# Patient Record
Sex: Female | Born: 1965 | Race: White | Hispanic: No | Marital: Married | Smoking: Former smoker
Health system: Southern US, Community
[De-identification: ages and names within clinical notes are randomized; demographics above are authoritative.]

## PROBLEM LIST (undated history)

## (undated) DIAGNOSIS — Z973 Presence of spectacles and contact lenses: Secondary | ICD-10-CM

## (undated) DIAGNOSIS — K819 Cholecystitis, unspecified: Secondary | ICD-10-CM

## (undated) HISTORY — PX: GANGLION CYST EXCISION: SHX1691

---

## 1997-09-06 ENCOUNTER — Other Ambulatory Visit: Admission: RE | Admit: 1997-09-06 | Discharge: 1997-09-06 | Payer: Self-pay | Admitting: *Deleted

## 1998-04-04 ENCOUNTER — Inpatient Hospital Stay (HOSPITAL_COMMUNITY): Admission: AD | Admit: 1998-04-04 | Discharge: 1998-04-06 | Payer: Self-pay | Admitting: *Deleted

## 1998-05-18 ENCOUNTER — Other Ambulatory Visit: Admission: RE | Admit: 1998-05-18 | Discharge: 1998-05-18 | Payer: Self-pay | Admitting: Obstetrics and Gynecology

## 1999-03-28 ENCOUNTER — Other Ambulatory Visit: Admission: RE | Admit: 1999-03-28 | Discharge: 1999-03-28 | Payer: Self-pay | Admitting: Obstetrics and Gynecology

## 1999-07-09 ENCOUNTER — Other Ambulatory Visit: Admission: RE | Admit: 1999-07-09 | Discharge: 1999-07-09 | Payer: Self-pay | Admitting: Obstetrics and Gynecology

## 1999-07-16 ENCOUNTER — Encounter: Admission: RE | Admit: 1999-07-16 | Discharge: 1999-10-14 | Payer: Self-pay | Admitting: Obstetrics and Gynecology

## 1999-10-16 ENCOUNTER — Inpatient Hospital Stay (HOSPITAL_COMMUNITY): Admission: AD | Admit: 1999-10-16 | Discharge: 1999-10-18 | Payer: Self-pay | Admitting: Obstetrics and Gynecology

## 1999-11-27 ENCOUNTER — Other Ambulatory Visit: Admission: RE | Admit: 1999-11-27 | Discharge: 1999-11-27 | Payer: Self-pay | Admitting: Obstetrics and Gynecology

## 2001-04-13 ENCOUNTER — Other Ambulatory Visit: Admission: RE | Admit: 2001-04-13 | Discharge: 2001-04-13 | Payer: Self-pay | Admitting: Obstetrics and Gynecology

## 2008-11-24 ENCOUNTER — Other Ambulatory Visit: Admission: RE | Admit: 2008-11-24 | Discharge: 2008-11-24 | Payer: Self-pay | Admitting: Family Medicine

## 2008-11-24 ENCOUNTER — Ambulatory Visit: Payer: Self-pay | Admitting: Family Medicine

## 2008-11-24 ENCOUNTER — Encounter: Payer: Self-pay | Admitting: Family Medicine

## 2008-11-24 LAB — CONVERTED CEMR LAB
ALT: 32 units/L (ref 0–35)
AST: 21 units/L (ref 0–37)
Albumin: 4.2 g/dL (ref 3.5–5.2)
Alkaline Phosphatase: 45 units/L (ref 39–117)
Chloride: 104 meq/L (ref 96–112)
Cholesterol: 172 mg/dL (ref 0–200)
GFR calc non Af Amer: 83.93 mL/min (ref >60)
Glucose, Bld: 100 mg/dL — ABNORMAL HIGH (ref 70–99)
Potassium: 4.4 meq/L (ref 3.5–5.1)
Sodium: 140 meq/L (ref 135–145)
Total Bilirubin: 0.8 mg/dL (ref 0.3–1.2)
VLDL: 12.2 mg/dL (ref 0.0–40.0)

## 2008-11-28 ENCOUNTER — Encounter (INDEPENDENT_AMBULATORY_CARE_PROVIDER_SITE_OTHER): Payer: Self-pay | Admitting: *Deleted

## 2008-12-21 ENCOUNTER — Ambulatory Visit: Payer: Self-pay | Admitting: Family Medicine

## 2008-12-21 ENCOUNTER — Encounter: Payer: Self-pay | Admitting: Family Medicine

## 2010-06-01 NOTE — H&P (Signed)
[facility]  Patient:    Carrie Gray, Carrie Gray                       MRN: [id number] Adm. Date:  [id number] Attending:  Genia Del                         History and Physical  CHIEF COMPLAINT:              Active labor.  HISTORY OF PRESENT ILLNESS:   A 45 year old white female, gravida 3, para 2, EDD October 16, 1999, at [redacted] weeks gestation in active labor.  ALLERGIES:                    No known drug allergies.  MEDICATIONS:                  Prenatal vitamins.  PAST OBSTETRIC HISTORY:       History of two spontaneous vaginal deliveries in 1997 and 2000.  No other medical or surgical hospitalizations.  FAMILY HISTORY:               Insulin-dependent diabetes mellitus, heart disease, epilepsy, and thyroid disease.  SOCIAL HISTORY:               Noncontributory.  PRENATAL LABORATORY DATA:     Blood type A positive, rh antibody negative. Rubella immune.  Hepatitis B surface antigen negative.  HIV nonreactive. Pregnancy otherwise uncomplicated.  PHYSICAL EXAMINATION:  GENERAL:                      A well-developed, well-nourished white female in no acute distress.  HEENT:                        Normal.  LUNGS:                        Clear.  HEART:                        Regular rate and rhythm.  ABDOMEN:                      Soft, gravid, and nontender.  Estimated fetal weight of about 5-1/2 pounds to 6 pounds.  PELVIC:                       Cervix is 5 cm, 90%, vertex and 0.  EXTREMITIES:                  No cords.  NEUROLOGICAL:                 Nonfocal.  IMPRESSION:                   Term intrauterine pregnancy in active labor.  PLAN:                         Proceed with artificial rupture of membranes, Pitocin augmentation as needed. DD:  10/16/99 TD:  10/16/99 Job: [id number]

## 2010-06-01 NOTE — H&P (Signed)
[facility]  Patient:    Carrie Gray, Carrie Gray                       MRN: [id number] Adm. Date:  [id number] Attending:  Genia Del                         History and Physical  CHIEF COMPLAINT:              Contracting every four to six minutes.  HISTORY OF PRESENT LABOR:     This is a 45 year old gravida 3, para 1-1-0-2 female, now at term, being admitted in early labor.  The patient presented to the office complaining of contractions every four to six minutes with an intact membrane.  On examination, she was 4, 80%, -1 to 0 station with bulging membranes.  Her group B strep culture is negative.  The patient had her membranes stripped on October 15, 1999.  At that time, her cervix 3, 60% and -2.  Her prenatal course has been complicated by growth restriction early in the pregnancy which has subsequently resolved.  Her last ultrasound was on September 19, 1999, at which time the babys weight was 5 pounds 10 ounces, which is at the 21st percentile, and normal amniotic fluid index.  Prenatal care is at [facility].  Primary obstetrician:  Dr. Nena Jordan A. Cousins. Blood type is A-positive.  Antibody screen is negative.  RPR is nonreactive. Rubella is immune.  Hepatitis B surface antigen is negative.  GC and Chlamydia cultures were negative.  AFP-3 test was normal.  Normal one-hour GCT.  Patient is immune to parvovirus.  Group B strep culture is negative.  Patient was followed for asymmetrical intrauterine growth restriction and had a second opinion by Dr. Conni Elliot at [facility] regarding the management.  ALLERGIES:                    No known drug allergies.  MEDICINES:                    Prenatal vitamins.  MEDICAL HISTORY:              Negative.  SURGICAL HISTORY:             Negative.  OBSTETRICAL HISTORY:          Vaginal delivery, 6-pound 5-ounce baby at 36-1/2 weeks, 1997; March 2000, term delivery, 7 pounds 2 ounces, vaginal  delivery, no complications.  FAMILY HISTORY:               Mother had a partial thyroidectomy.  Maternal grandmother has insulin-dependent diabetes.  Father died of an MI at age 48.  SOCIAL HISTORY:               Married.  Nonsmoker.  Homemaker.  Two children.  PHYSICAL EXAMINATION  GENERAL:                      Well-developed, well-nourished gravid female in no acute distress.  VITAL SIGNS:                  Blood pressure 100/70.  Weight is 166.8 pounds.  SKIN:                         No lesions.  HEENT:  Anicteric sclerae.  Pink conjunctivae. Oropharynx negative.  HEART:                        Regular rate and rhythm.  BREASTS:                      Soft, nontender.  No palpable mass.  LUNGS:                        Clear to auscultation.  ABDOMEN:                      Gravid, fundal height of 36 cm.  PELVIC:                       As per history of present illness.  IMPRESSION:                   Term gestation, early labor.  PLAN:                         Admission, routine admission labs, patient to ambulate for an hour after reactive NST, artificial rupture of membranes p.r.n., analgesics p.r.n. DD:  10/16/99 TD:  10/16/99 Job: [id number]

## 2010-11-12 ENCOUNTER — Telehealth: Payer: Self-pay | Admitting: *Deleted

## 2010-11-12 NOTE — Telephone Encounter (Signed)
Chart opened in error

## 2012-06-03 ENCOUNTER — Encounter: Payer: Self-pay | Admitting: Family Medicine

## 2012-06-03 ENCOUNTER — Ambulatory Visit (INDEPENDENT_AMBULATORY_CARE_PROVIDER_SITE_OTHER): Payer: BC Managed Care – PPO | Admitting: Family Medicine

## 2012-06-03 VITALS — BP 122/90 | HR 84 | Temp 97.9°F | Wt 165.0 lb

## 2012-06-03 DIAGNOSIS — J309 Allergic rhinitis, unspecified: Secondary | ICD-10-CM

## 2012-06-03 DIAGNOSIS — J029 Acute pharyngitis, unspecified: Secondary | ICD-10-CM

## 2012-06-03 NOTE — Progress Notes (Signed)
SUBJECTIVE:  Carrie Gray is a 47 y.o. female who complains of sneezing, dry cough and sore throat for 3 days. She denies a history of anorexia, chest pain, chills, dizziness, fatigue, fevers, nausea, shortness of breath, sweats and vomiting and denies a history of asthma. Patient denies smoke cigarettes.   Does have h/o seasonal allergies.  Patient Active Problem List   Diagnosis Date Noted  . Allergic rhinitis 06/03/2012   No past medical history on file. No past surgical history on file. History  Substance Use Topics  . Smoking status: Former Games developer  . Smokeless tobacco: Not on file  . Alcohol Use: Not on file   No family history on file. No Known Allergies No current outpatient prescriptions on file prior to visit.   No current facility-administered medications on file prior to visit.   The PMH, PSH, Social History, Family History, Medications, and allergies have been reviewed in [facility], and have been updated if relevant.  OBJECTIVE: BP 122/90  Pulse 84  Temp(Src) 97.9 F (36.6 C)  Wt 165 lb (74.844 kg)  BMI 28.77 kg/m2  She appears well, vital signs are as noted. Ears normal.  Throat and pharynx normal.  Neck supple. No adenopathy in the neck. Nose is congested. Sinuses non tender. The chest is clear, without wheezes or rales.  ASSESSMENT:  allergic rhinitis  PLAN: Symptomatic therapy suggested: push fluids, rest and return office visit prn if symptoms persist or worsen.  Call or return to clinic prn if these symptoms worsen or fail to improve as anticipated.

## 2012-06-03 NOTE — Addendum Note (Signed)
Addended by: Eliezer Bottom on: 06/03/2012 12:18 PM   Modules accepted: Orders

## 2012-06-03 NOTE — Patient Instructions (Addendum)
Good to see you. Have a great anniversary vacation!  Try an antihistamine like claritin, allegra or zyrtec D over the counter.  Try over the counter nasocort-start with 2 sprays per nostril per day...and then try to taper to 1 spray per nostril once symptoms improve.

## 2012-06-12 ENCOUNTER — Telehealth: Payer: Self-pay | Admitting: Family Medicine

## 2012-06-12 MED ORDER — AZITHROMYCIN 250 MG PO TABS: ORAL_TABLET | ORAL | Status: DC

## 2012-06-12 NOTE — Telephone Encounter (Signed)
Rx for zpack sent to pharmacy.  Please keep [place] updated with symptoms and I hope she feels better soon.

## 2012-06-12 NOTE — Telephone Encounter (Signed)
Patient Information:  Caller Name: Rhilee  Phone: [phone]  Patient: Carrie, Gray  Gender: Female  DOB: Jul 10, 1965  Age: 47 Years  PCP: [facility])  Pregnant: No  Office Follow Up:  Does the office need to follow up with this patient?: Yes  Instructions For The Office: Pt wants to be seen today if possible. 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 Since pt was seen last week/also is no appts /would MD consider calling in an antibiotic.   Symptoms  Reason For Call & Symptoms: Pt was seen on 06/03/12 for a sore throat/cough. Pt was advised by MD it was allergies. Pt was advised to take Claritin which she did which did not help. Sore throat  has resolved but pt is calling back to say the cough has worsened. Pt has a temp this am of 100 (orally) and is wondering if it has turned into an infection. cough is non-productive.  Reviewed Health History In EMR: Yes  Reviewed Medications In EMR: Yes  Reviewed Allergies In EMR: Yes  Reviewed Surgeries / Procedures: Yes  Date of Onset of Symptoms: 06/03/2012 OB / GYN:  LMP: Unknown  Guideline(s) Used:  Cough  Disposition Per Guideline:   See Within 3 Days in Office  Reason For Disposition Reached:   Cough has been present for > 10 days  Advice Given:  N/A  Patient Will Follow Care Advice:  YES

## 2012-06-12 NOTE — Telephone Encounter (Signed)
Advised patient

## 2012-07-31 ENCOUNTER — Ambulatory Visit (INDEPENDENT_AMBULATORY_CARE_PROVIDER_SITE_OTHER): Payer: BC Managed Care – PPO | Admitting: Family Medicine

## 2012-07-31 ENCOUNTER — Other Ambulatory Visit (HOSPITAL_COMMUNITY)
Admission: RE | Admit: 2012-07-31 | Discharge: 2012-07-31 | Disposition: A | Discharge status: Home / Self Care | Payer: BC Managed Care – PPO | Source: Ambulatory Visit | Attending: Family Medicine | Admitting: Family Medicine

## 2012-07-31 ENCOUNTER — Encounter: Payer: Self-pay | Admitting: Family Medicine

## 2012-07-31 VITALS — BP 110/80 | HR 80 | Temp 98.1°F | Ht 64.0 in | Wt 167.0 lb

## 2012-07-31 DIAGNOSIS — Z0000 Encounter for general adult medical examination without abnormal findings: Secondary | ICD-10-CM

## 2012-07-31 DIAGNOSIS — Z136 Encounter for screening for cardiovascular disorders: Secondary | ICD-10-CM

## 2012-07-31 DIAGNOSIS — Z01419 Encounter for gynecological examination (general) (routine) without abnormal findings: Secondary | ICD-10-CM

## 2012-07-31 DIAGNOSIS — Z1151 Encounter for screening for human papillomavirus (HPV): Secondary | ICD-10-CM | POA: Insufficient documentation

## 2012-07-31 DIAGNOSIS — Z1231 Encounter for screening mammogram for malignant neoplasm of breast: Secondary | ICD-10-CM

## 2012-07-31 LAB — CBC WITH DIFFERENTIAL/PLATELET
Basophils Absolute: 0 10*3/uL (ref 0.0–0.1)
Basophils Relative: 0 % (ref 0–1)
Eosinophils Absolute: 0.1 10*3/uL (ref 0.0–0.7)
Eosinophils Relative: 1 % (ref 0–5)
MCH: 30.1 pg (ref 26.0–34.0)
MCHC: 34 g/dL (ref 30.0–36.0)
MCV: 88.5 fL (ref 78.0–100.0)
Platelets: 254 10*3/uL (ref 150–400)
RDW: 13.6 % (ref 11.5–15.5)

## 2012-07-31 NOTE — Patient Instructions (Addendum)
Good to see you.  We will call you with your lab results or you may view them online.  Please stop by to see Carrie Gray on your way out to set up your mammogram.  Have a great time in [place]!

## 2012-07-31 NOTE — Progress Notes (Signed)
Subjective:    Patient ID: Carrie Gray, female    DOB: 07/11/1965, 47 y.o.   MRN: [id number]  HPI  47 yo pleasant G3P3 here to re establish care and for CPX/pap smear.  No h/o abnormal pap smears.  Sexually active with husband only. Last pap smear and mammogram were in 2010.  Periods are still regular.  Denies any insomnia, hot flashes, vaginal discharge or dysuria.  Mother recently diagnosed with colon CA at 45 yo.  She feels she is handling this new stressor ok.  She has no family h/o breast, uterine or cervical CA.  Tries to stay active- has 3 children.  Patient Active Problem List   Diagnosis Date Noted  . Routine general medical examination at a health care facility 07/31/2012  . Routine gynecological examination 07/31/2012  . Allergic rhinitis 06/03/2012   No past medical history on file. No past surgical history on file. History  Substance Use Topics  . Smoking status: Former Games developer  . Smokeless tobacco: Not on file  . Alcohol Use: Not on file   Family History  Problem Relation Age of Onset  . Cancer Mother 71    colon   No Known Allergies No current outpatient prescriptions on file prior to visit.   No current facility-administered medications on file prior to visit.   The PMH, PSH, Social History, Family History, Medications, and allergies have been reviewed in [facility], and have been updated if relevant.    Review of Systems See HPI Patient reports no  vision/ hearing changes,anorexia, weight change, fever ,adenopathy, persistant / recurrent hoarseness, swallowing issues, chest pain, edema,persistant / recurrent cough, hemoptysis, dyspnea(rest, exertional, paroxysmal nocturnal), gastrointestinal  bleeding (melena, rectal bleeding), abdominal pain, excessive heart burn, GU symptoms(dysuria, hematuria, pyuria, voiding/incontinence  Issues) syncope, focal weakness, severe memory loss, concerning skin lesions, depression, anxiety, abnormal bruising/bleeding, major  joint swelling, breast masses or abnormal vaginal bleeding.       Objective:   Physical Exam BP 110/80  Pulse 80  Temp(Src) 98.1 F (36.7 C)  Ht 5\' 4"  (1.626 m)  Wt 167 lb (75.751 kg)  BMI 28.65 kg/m2  General:  Well-developed,well-nourished,in no acute distress; alert,appropriate and cooperative throughout examination Head:  normocephalic and atraumatic.   Eyes:  vision grossly intact, pupils equal, pupils round, and pupils reactive to light.   Ears:  R ear normal and L ear normal.   Nose:  no external deformity.   Mouth:  good dentition.   Neck:  No deformities, masses, or tenderness noted. Breasts:  No mass, nodules, thickening, tenderness, bulging, retraction, inflamation, nipple discharge or skin changes noted.   Lungs:  Normal respiratory effort, chest expands symmetrically. Lungs are clear to auscultation, no crackles or wheezes. Heart:  Normal rate and regular rhythm. S1 and S2 normal without gallop, murmur, click, rub or other extra sounds. Abdomen:  Bowel sounds positive,abdomen soft and non-tender without masses, organomegaly or hernias noted. Rectal:  no external abnormalities.   Genitalia:  Pelvic Exam:        External: normal female genitalia without lesions or masses        Vagina: normal without lesions or masses        Cervix: normal without lesions or masses        Adnexa: normal bimanual exam without masses or fullness        Uterus: normal by palpation        Pap smear: performed Msk:  No deformity or scoliosis noted of thoracic or lumbar spine.  Extremities:  No clubbing, cyanosis, edema, or deformity noted with normal full range of motion of all joints.   Neurologic:  alert & oriented X3 and gait normal.   Skin:  Intact without suspicious lesions or rashes Cervical Nodes:  No lymphadenopathy noted Axillary Nodes:  No palpable lymphadenopathy Psych:  Cognition and judgment appear intact. Alert and cooperative with normal attention span and concentration. No  apparent delusions, illusions, hallucinations        Assessment & Plan:  1. Routine general medical examination at a health care facility Reviewed preventive care protocols, scheduled due services, and updated immunizations Discussed nutrition, exercise, diet, and healthy lifestyle.  - Comprehensive metabolic panel - CBC with Differential  2. Routine gynecological examination Pap smear today.  3. Other screening mammogram  - MM Digital Screening; Future  4. Screening for ischemic heart disease  - Lipid Panel

## 2012-08-01 LAB — LIPID PANEL
Cholesterol: 208 mg/dL — ABNORMAL HIGH (ref 0–200)
HDL: 42 mg/dL (ref >39)
Total CHOL/HDL Ratio: 5 Ratio
VLDL: 38 mg/dL (ref 0–40)

## 2012-08-01 LAB — COMPREHENSIVE METABOLIC PANEL
BUN: 12 mg/dL (ref 6–23)
CO2: 26 mEq/L (ref 19–32)
Calcium: 9.4 mg/dL (ref 8.4–10.5)
Chloride: 104 mEq/L (ref 96–112)
Creat: 0.96 mg/dL (ref 0.50–1.10)
Total Bilirubin: 0.4 mg/dL (ref 0.3–1.2)

## 2012-08-04 ENCOUNTER — Encounter: Payer: Self-pay | Admitting: *Deleted

## 2012-08-05 ENCOUNTER — Encounter: Payer: Self-pay | Admitting: *Deleted

## 2012-11-19 ENCOUNTER — Other Ambulatory Visit: Payer: Self-pay

## 2014-08-17 ENCOUNTER — Other Ambulatory Visit (HOSPITAL_COMMUNITY)
Admission: RE | Admit: 2014-08-17 | Discharge: 2014-08-17 | Disposition: A | Discharge status: Home / Self Care | Payer: BC Managed Care – PPO | Source: Ambulatory Visit | Attending: Family Medicine | Admitting: Family Medicine

## 2014-08-17 ENCOUNTER — Ambulatory Visit (INDEPENDENT_AMBULATORY_CARE_PROVIDER_SITE_OTHER): Payer: BC Managed Care – PPO | Admitting: Family Medicine

## 2014-08-17 ENCOUNTER — Encounter: Payer: Self-pay | Admitting: Family Medicine

## 2014-08-17 VITALS — BP 120/60 | HR 78 | Temp 98.0°F | Ht 64.0 in | Wt 169.0 lb

## 2014-08-17 DIAGNOSIS — Z0000 Encounter for general adult medical examination without abnormal findings: Secondary | ICD-10-CM | POA: Diagnosis not present

## 2014-08-17 DIAGNOSIS — Z1239 Encounter for other screening for malignant neoplasm of breast: Secondary | ICD-10-CM

## 2014-08-17 DIAGNOSIS — Z01419 Encounter for gynecological examination (general) (routine) without abnormal findings: Secondary | ICD-10-CM | POA: Insufficient documentation

## 2014-08-17 DIAGNOSIS — F4321 Adjustment disorder with depressed mood: Secondary | ICD-10-CM | POA: Diagnosis not present

## 2014-08-17 DIAGNOSIS — R42 Dizziness and giddiness: Secondary | ICD-10-CM | POA: Diagnosis not present

## 2014-08-17 LAB — CBC WITH DIFFERENTIAL/PLATELET
BASOS ABS: 0 10*3/uL (ref 0.0–0.1)
Basophils Relative: 0.5 % (ref 0.0–3.0)
EOS PCT: 0.6 % (ref 0.0–5.0)
Eosinophils Absolute: 0 10*3/uL (ref 0.0–0.7)
HEMATOCRIT: 43.2 % (ref 36.0–46.0)
Hemoglobin: 14.5 g/dL (ref 12.0–15.0)
LYMPHS PCT: 28.8 % (ref 12.0–46.0)
Lymphs Abs: 2.1 10*3/uL (ref 0.7–4.0)
MCHC: 33.6 g/dL (ref 30.0–36.0)
MCV: 88.7 fl (ref 78.0–100.0)
MONOS PCT: 7.9 % (ref 3.0–12.0)
Monocytes Absolute: 0.6 10*3/uL (ref 0.1–1.0)
Neutro Abs: 4.5 10*3/uL (ref 1.4–7.7)
Neutrophils Relative %: 62.2 % (ref 43.0–77.0)
Platelets: 241 10*3/uL (ref 150.0–400.0)
RBC: 4.86 Mil/uL (ref 3.87–5.11)
RDW: 12.9 % (ref 11.5–15.5)
WBC: 7.2 10*3/uL (ref 4.0–10.5)

## 2014-08-17 LAB — COMPREHENSIVE METABOLIC PANEL
ALBUMIN: 4.4 g/dL (ref 3.5–5.2)
ALT: 33 U/L (ref 0–35)
AST: 23 U/L (ref 0–37)
Alkaline Phosphatase: 45 U/L (ref 39–117)
BILIRUBIN TOTAL: 0.5 mg/dL (ref 0.2–1.2)
BUN: 13 mg/dL (ref 6–23)
CALCIUM: 9.2 mg/dL (ref 8.4–10.5)
CHLORIDE: 106 meq/L (ref 96–112)
CO2: 27 mEq/L (ref 19–32)
Creatinine, Ser: 0.98 mg/dL (ref 0.40–1.20)
GFR: 64.67 mL/min (ref >60.00)
Glucose, Bld: 97 mg/dL (ref 70–99)
Potassium: 4.3 mEq/L (ref 3.5–5.1)
SODIUM: 139 meq/L (ref 135–145)
Total Protein: 7.1 g/dL (ref 6.0–8.3)

## 2014-08-17 LAB — LIPID PANEL
CHOLESTEROL: 185 mg/dL (ref 0–200)
HDL: 53 mg/dL (ref >39.00)
LDL Cholesterol: 108 mg/dL — ABNORMAL HIGH (ref 0–99)
NonHDL: 131.57
TRIGLYCERIDES: 117 mg/dL (ref 0.0–149.0)
Total CHOL/HDL Ratio: 3
VLDL: 23.4 mg/dL (ref 0.0–40.0)

## 2014-08-17 LAB — TSH: TSH: 4.05 u[IU]/mL (ref 0.35–4.50)

## 2014-08-17 LAB — VITAMIN B12: VITAMIN B 12: 322 pg/mL (ref 211–911)

## 2014-08-17 NOTE — Progress Notes (Signed)
Pre visit review using our clinic review tool, if applicable. No additional management support is needed unless otherwise documented below in the visit note. 

## 2014-08-17 NOTE — Assessment & Plan Note (Signed)
Very early stages of grief, grief counseling strongly suggested. Offered my condolences and support.

## 2014-08-17 NOTE — Assessment & Plan Note (Signed)
New- likely due to anxiety related to recent stressors. Exam and history reassuring. Start with labs today for further work up, she will seek out grief counseling through hospice and keep me updated. The patient indicates understanding of these issues and agrees with the plan.

## 2014-08-17 NOTE — Progress Notes (Signed)
Subjective:    Patient ID: Carrie Gray, female    DOB: 1965/04/09, 49 y.o.   MRN: [id number]  HPI  49 yo pleasant G3P3 here for CPX/pap smear and to discuss dizziness.  Mom died last week.  She had been sick with colon cancer for 2 years. She has not yet started grief counseling through hospice.  Sleeping ok.  Tearful but feels she is accepting the loss.  Has been having dizzy spells that go away when she takes a deep breath for past couple of months. She never feels like she will pass out or vomit. No CP or SOB.  No changes in vision when this happens.  Work has been more stressful as well.  No h/o abnormal pap smears.  Sexually active with husband only. Last pap smear was done on 08/05/12 (me) and mammogram was 2010. Eye exam 3 weeks- going through further testing for glaucoma.  Periods are still regular.  Denies any insomnia, hot flashes, vaginal discharge or dysuria.  She has no family h/o breast, uterine or cervical CA.   Lab Results  Component Value Date   CREATININE 0.96 07/31/2012   Lab Results  Component Value Date   CHOL 208* 07/31/2012   HDL 42 07/31/2012   LDLCALC 128* 07/31/2012   TRIG 190* 07/31/2012   CHOLHDL 5.0 07/31/2012   Lab Results  Component Value Date   WBC 6.5 07/31/2012   HGB 13.8 07/31/2012   HCT 40.6 07/31/2012   MCV 88.5 07/31/2012   PLT 254 07/31/2012   No results found for: TSH  Patient Active Problem List   Diagnosis Date Noted  . Routine general medical examination at a health care facility 07/31/2012  . Routine gynecological examination 07/31/2012  . Allergic rhinitis 06/03/2012   No past medical history on file. No past surgical history on file. History  Substance Use Topics  . Smoking status: Former Games developer  . Smokeless tobacco: Never Used  . Alcohol Use: 0.0 oz/week    0 Standard drinks or equivalent per week   Family History  Problem Relation Age of Onset  . Cancer Mother 30    colon   No Known Allergies No  current outpatient prescriptions on file prior to visit.   No current facility-administered medications on file prior to visit.   The PMH, PSH, Social History, Family History, Medications, and allergies have been reviewed in [facility], and have been updated if relevant.    Review of Systems  Constitutional: Negative.   HENT: Negative.   Eyes: Negative.   Respiratory: Negative.   Cardiovascular: Negative.   Gastrointestinal: Negative.   Endocrine: Negative.   Genitourinary: Negative.   Musculoskeletal: Negative.   Skin: Negative.   Allergic/Immunologic: Negative.   Neurological: Positive for dizziness. Negative for tremors, seizures, syncope, facial asymmetry, speech difficulty, weakness, light-headedness, numbness and headaches.  Hematological: Negative.   Psychiatric/Behavioral: Negative.        Objective:   Physical Exam BP 120/60 mmHg  Pulse 78  Temp(Src) 98 F (36.7 C) (Tympanic)  Ht 5\' 4"  (1.626 m)  Wt 169 lb (76.658 kg)  BMI 28.99 kg/m2  SpO2 98%  LMP 07/26/2014  General:  Well-developed,well-nourished,in no acute distress; alert,appropriate and cooperative throughout examination Head:  normocephalic and atraumatic.   Eyes:  vision grossly intact, pupils equal, pupils round, and pupils reactive to light.   Ears:  R ear normal and L ear normal.   Nose:  no external deformity.   Mouth:  good dentition.   Neck:  No deformities, masses, or tenderness noted. Breasts:  No mass, nodules, thickening, tenderness, bulging, retraction, inflamation, nipple discharge or skin changes noted.   Lungs:  Normal respiratory effort, chest expands symmetrically. Lungs are clear to auscultation, no crackles or wheezes. Heart:  Normal rate and regular rhythm. S1 and S2 normal without gallop, murmur, click, rub or other extra sounds. Abdomen:  Bowel sounds positive,abdomen soft and non-tender without masses, organomegaly or hernias noted. Rectal:  no external abnormalities.   Genitalia:   Pelvic Exam:        External: normal female genitalia without lesions or masses        Vagina: normal without lesions or masses        Cervix: normal without lesions or masses        Adnexa: normal bimanual exam without masses or fullness        Uterus: normal by palpation        Pap smear: performed Msk:  No deformity or scoliosis noted of thoracic or lumbar spine.   Extremities:  No clubbing, cyanosis, edema, or deformity noted with normal full range of motion of all joints.   Neurologic:  alert & oriented X3 and gait normal.   Skin:  Intact without suspicious lesions or rashes Cervical Nodes:  No lymphadenopathy noted Axillary Nodes:  No palpable lymphadenopathy Psych:  Cognition and judgment appear intact. Alert and cooperative with normal attention span and concentration. No apparent delusions, illusions, hallucinations        Assessment & Plan:

## 2014-08-17 NOTE — Assessment & Plan Note (Signed)
Reviewed preventive care protocols, scheduled due services, and updated immunizations Discussed nutrition, exercise, diet, and healthy lifestyle.  Orders Placed This Encounter  Procedures  . MM Digital Screening  . CBC with Differential/Platelet  . Comprehensive metabolic panel  . Lipid panel  . TSH  . Vitamin B12

## 2014-08-17 NOTE — Patient Instructions (Signed)
Great to see you. I am so sorry for your loss. We will call you with your lab results.  Please call to schedule your mammogram.

## 2014-08-17 NOTE — Addendum Note (Signed)
Addended by: Sueanne Margarita on: 08/17/2014 08:07 AM   Modules accepted: Orders

## 2014-08-17 NOTE — Assessment & Plan Note (Signed)
Pap smear done today. Mammogram ordered- she will call to schedule.

## 2014-08-18 ENCOUNTER — Encounter: Payer: Self-pay | Admitting: Family Medicine

## 2014-08-18 LAB — CYTOLOGY - PAP

## 2016-02-23 ENCOUNTER — Telehealth: Payer: Self-pay

## 2016-02-23 MED ORDER — OSELTAMIVIR PHOSPHATE 75 MG PO CAPS: 75.0000 mg | ORAL_CAPSULE | Freq: Every day | ORAL | 0 refills | Status: DC

## 2016-02-23 NOTE — Telephone Encounter (Signed)
eRx sent

## 2016-02-23 NOTE — Telephone Encounter (Signed)
Mr Carrie Gray said pt was exposed to FLu part A; daughter just diagnosed and request [facility] to [facility] [employer] [address]. Pt does not have any symptoms yet but request for preventative measures; [place]. Please advise.

## 2016-09-09 ENCOUNTER — Ambulatory Visit (INDEPENDENT_AMBULATORY_CARE_PROVIDER_SITE_OTHER): Payer: Commercial Managed Care - PPO | Admitting: Family Medicine

## 2016-09-09 ENCOUNTER — Encounter: Payer: Self-pay | Admitting: Family Medicine

## 2016-09-09 DIAGNOSIS — R109 Unspecified abdominal pain: Secondary | ICD-10-CM | POA: Insufficient documentation

## 2016-09-09 DIAGNOSIS — R1010 Upper abdominal pain, unspecified: Secondary | ICD-10-CM

## 2016-09-09 LAB — CBC WITH DIFFERENTIAL/PLATELET
BASOS PCT: 0.6 % (ref 0.0–3.0)
Basophils Absolute: 0 10*3/uL (ref 0.0–0.1)
EOS PCT: 0.6 % (ref 0.0–5.0)
Eosinophils Absolute: 0 10*3/uL (ref 0.0–0.7)
HEMATOCRIT: 41.3 % (ref 36.0–46.0)
HEMOGLOBIN: 14.1 g/dL (ref 12.0–15.0)
LYMPHS PCT: 39.5 % (ref 12.0–46.0)
Lymphs Abs: 1.9 10*3/uL (ref 0.7–4.0)
MCHC: 34.1 g/dL (ref 30.0–36.0)
MCV: 89.7 fl (ref 78.0–100.0)
Monocytes Absolute: 0.5 10*3/uL (ref 0.1–1.0)
Monocytes Relative: 9.7 % (ref 3.0–12.0)
Neutro Abs: 2.4 10*3/uL (ref 1.4–7.7)
Neutrophils Relative %: 49.6 % (ref 43.0–77.0)
Platelets: 223 10*3/uL (ref 150.0–400.0)
RBC: 4.6 Mil/uL (ref 3.87–5.11)
RDW: 13.2 % (ref 11.5–15.5)
WBC: 4.9 10*3/uL (ref 4.0–10.5)

## 2016-09-09 LAB — COMPREHENSIVE METABOLIC PANEL
ALBUMIN: 4.4 g/dL (ref 3.5–5.2)
ALK PHOS: 94 U/L (ref 39–117)
ALT: 254 U/L — AB (ref 0–35)
AST: 53 U/L — ABNORMAL HIGH (ref 0–37)
BILIRUBIN TOTAL: 0.5 mg/dL (ref 0.2–1.2)
BUN: 11 mg/dL (ref 6–23)
CALCIUM: 9.6 mg/dL (ref 8.4–10.5)
CO2: 31 meq/L (ref 19–32)
Chloride: 103 mEq/L (ref 96–112)
Creatinine, Ser: 0.95 mg/dL (ref 0.40–1.20)
GFR: 66.45 mL/min (ref >60.00)
Glucose, Bld: 101 mg/dL — ABNORMAL HIGH (ref 70–99)
Potassium: 4.7 mEq/L (ref 3.5–5.1)
Sodium: 138 mEq/L (ref 135–145)
TOTAL PROTEIN: 7.1 g/dL (ref 6.0–8.3)

## 2016-09-09 LAB — LIPASE: Lipase: 37 U/L (ref 11.0–59.0)

## 2016-09-09 NOTE — Progress Notes (Signed)
   Subjective:   Patient ID: Carrie Gray, female    DOB: Jan 02, 1966, 51 y.o.   MRN: [id number]  Eunita Kerstiens is a pleasant 51 y.o. year old female who presents to clinic today with Acute Visit (abd pain x 3-4 months intermittently)  on 09/09/2016  HPI:  Upper abdominal pain- intermittent for months.  Seems to be more frequent and higher intensity.  Now a 7/10 when it happens. Aggravated by eating by she is not sure if certain foods make it worse.  Has not tried to take anything for it.  Can last minutes to hours. Usually mid upper abdomen- can radiate RUQ and lower quadrant.  Often associated with nausea, no vomiting. No fevers.  No changes in bowel habits or blood in her stool. No black stools.   No current outpatient prescriptions on file prior to visit.   No current facility-administered medications on file prior to visit.     No Known Allergies  No past medical history on file.  No past surgical history on file.  Family History  Problem Relation Age of Onset  . Cancer Mother 85       colon    Social History   Social History  . Marital status: Single    Spouse name: N/A  . Number of children: N/A  . Years of education: N/A   Occupational History  . Not on file.   Social History Main Topics  . Smoking status: Former Games developer  . Smokeless tobacco: Never Used  . Alcohol use 0.0 oz/week  . Drug use: No  . Sexual activity: Not on file   Other Topics Concern  . Not on file   Social History Narrative   Married, 3 children.   Works at [employer] as Psychologist, counselling.   The PMH, PSH, Social History, Family History, Medications, and allergies have been reviewed in [facility], and have been updated if relevant.   Review of Systems  Constitutional: Negative.   Gastrointestinal: Positive for abdominal pain. Negative for abdominal distention, anal bleeding, blood in stool, constipation, diarrhea, nausea, rectal pain and vomiting.  All other systems  reviewed and are negative.      Objective:    BP 122/78   Pulse 65   Temp 97.8 F (36.6 C)   Resp 18   SpO2 98%    Physical Exam  Constitutional: She is oriented to person, place, and time. She appears well-developed and well-nourished.  HENT:  Head: Normocephalic and atraumatic.  Eyes: Conjunctivae are normal.  Pulmonary/Chest: Effort normal.  Abdominal: Soft. Bowel sounds are normal. She exhibits no distension and no mass. There is no tenderness. There is no rebound and no guarding.  Musculoskeletal: Normal range of motion.  Neurological: She is alert and oriented to person, place, and time. No cranial nerve deficit.  Skin: Skin is warm and dry. She is not diaphoretic.  Psychiatric: She has a normal mood and affect. Her behavior is normal. Judgment and thought content normal.  Nursing note and vitals reviewed.         Assessment & Plan:   Pain of upper abdomen No Follow-up on file.

## 2016-09-09 NOTE — Assessment & Plan Note (Signed)
Persistent, intermittent. ?biliary colic. Labs today and RUQ [place]. The patient indicates understanding of these issues and agrees with the plan. Orders Placed This Encounter  Procedures  . US Abdomen Limited RUQ  . CBC with Differential/Platelet  . Lipase  . Comprehensive metabolic panel

## 2016-09-09 NOTE — Patient Instructions (Addendum)
Great to see you.Please stop by to see Carrie Gray on your way out.   Low-Fat Diet for Pancreatitis or Gallbladder Conditions A low-fat diet can be helpful if you have pancreatitis or a gallbladder condition. With these conditions, your pancreas and gallbladder have trouble digesting fats. A healthy eating plan with less fat will help rest your pancreas and gallbladder and reduce your symptoms. What do I need to know about this diet?  Eat a low-fat diet. ? Reduce your fat intake to less than 20-30% of your total daily calories. This is less than 50-60 g of fat per day. ? Remember that you need some fat in your diet. Ask your dietician what your daily goal should be. ? Choose nonfat and low-fat healthy foods. Look for the words "nonfat," "low fat," or "fat free." ? As a guide, look on the label and choose foods with less than 3 g of fat per serving. Eat only one serving.  Avoid alcohol.  Do not smoke. If you need help quitting, talk with your health care provider.  Eat small frequent meals instead of three large heavy meals. What foods can I eat? Grains Include healthy grains and starches such as potatoes, wheat bread, fiber-rich cereal, and brown rice. Choose whole grain options whenever possible. In adults, whole grains should account for 45-65% of your daily calories. Fruits and Vegetables Eat plenty of fruits and vegetables. Fresh fruits and vegetables add fiber to your diet. Meats and Other Protein Sources Eat lean meat such as chicken and pork. Trim any fat off of meat before cooking it. Eggs, fish, and beans are other sources of protein. In adults, these foods should account for 10-35% of your daily calories. Dairy Choose low-fat milk and dairy options. Dairy includes fat and protein, as well as calcium. Fats and Oils Limit high-fat foods such as fried foods, sweets, baked goods, sugary drinks. Other Creamy sauces and condiments, such as mayonnaise, can add extra fat. Think about  whether or not you need to use them, or use smaller amounts or low fat options. What foods are not recommended?  High fat foods, such as: ? [employer]. ? Ice cream. ? [place] toast. ? Sweet rolls. ? Pizza. ? Cheese bread. ? Foods covered with batter, butter, creamy sauces, or cheese. ? Fried foods. ? Sugary drinks and desserts.  Foods that cause gas or bloating This information is not intended to replace advice given to you by your health care provider. Make sure you discuss any questions you have with your health care provider. Document Released: 01/05/2013 Document Revised: 06/08/2015 Document Reviewed: 12/14/2012 Elsevier Interactive Patient Education  2017 [employer].

## 2016-09-10 ENCOUNTER — Other Ambulatory Visit: Payer: Self-pay | Admitting: Family Medicine

## 2016-09-10 ENCOUNTER — Ambulatory Visit (HOSPITAL_BASED_OUTPATIENT_CLINIC_OR_DEPARTMENT_OTHER)
Admission: RE | Admit: 2016-09-10 | Discharge: 2016-09-10 | Disposition: A | Discharge status: Home / Self Care | Payer: Commercial Managed Care - PPO | Source: Ambulatory Visit | Attending: Family Medicine | Admitting: Family Medicine

## 2016-09-10 DIAGNOSIS — R1010 Upper abdominal pain, unspecified: Secondary | ICD-10-CM

## 2016-09-10 DIAGNOSIS — K8010 Calculus of gallbladder with chronic cholecystitis without obstruction: Secondary | ICD-10-CM | POA: Diagnosis not present

## 2016-09-10 DIAGNOSIS — K8000 Calculus of gallbladder with acute cholecystitis without obstruction: Secondary | ICD-10-CM

## 2016-10-07 ENCOUNTER — Ambulatory Visit: Payer: Self-pay | Admitting: Surgery

## 2016-10-07 NOTE — Progress Notes (Signed)
PAT visit 10-08-16 . Please put orders in epic. thank you!

## 2016-10-07 NOTE — Patient Instructions (Signed)
Kambrea Carrasco  10/07/2016   Your procedure is scheduled on: 10-17-16  Report to [facility] Main  Entrance Take [place]  elevators to 3rd floor to  Short Stay Center at AM.   Call this number if you have problems the morning of surgery [phone]   Remember: ONLY 1 PERSON MAY GO WITH YOU TO SHORT STAY TO GET  READY MORNING OF YOUR SURGERY.  Do not eat food or drink liquids :After Midnight.     Take these medicines the morning of surgery with A SIP OF WATER: none  DO NOT TAKE ANY DIABETIC MEDICATIONS DAY OF YOUR SURGERY                               You may not have any metal on your body including hair pins and              piercings  Do not wear jewelry, make-up, lotions, powders or perfumes, deodorant             Do not wear nail polish.  Do not shave  48 hours prior to surgery.     Do not bring valuables to the hospital. [place] IS NOT             RESPONSIBLE   FOR VALUABLES.  Contacts, dentures or bridgework may not be worn into surgery.      Patients discharged the day of surgery will not be allowed to drive home.  Name and phone number of your driver:  Special Instructions: N/A              Please read over the following fact sheets you were given: _____________________________________________________________________           [facility] - Preparing for Surgery Before surgery, you can play an important role.  Because skin is not sterile, your skin needs to be as free of germs as possible.  You can reduce the number of germs on your skin by washing with CHG (chlorahexidine gluconate) soap before surgery.  CHG is an antiseptic cleaner which kills germs and bonds with the skin to continue killing germs even after washing. Please DO NOT use if you have an allergy to CHG or antibacterial soaps.  If your skin becomes reddened/irritated stop using the CHG and inform your nurse when you arrive at Short Stay. Do not shave (including legs and underarms)  for at least 48 hours prior to the first CHG shower.  You may shave your face/neck. Please follow these instructions carefully:  1.  Shower with CHG Soap the night before surgery and the  morning of Surgery.  2.  If you choose to wash your hair, wash your hair first as usual with your  normal  shampoo.  3.  After you shampoo, rinse your hair and body thoroughly to remove the  shampoo.                           4.  Use CHG as you would any other liquid soap.  You can apply chg directly  to the skin and wash                       Gently with a scrungie or clean washcloth.  5.  Apply the CHG  Soap to your body ONLY FROM THE NECK DOWN.   Do not use on face/ open                           Wound or open sores. Avoid contact with eyes, ears mouth and genitals (private parts).                       Wash face,  Genitals (private parts) with your normal soap.             6.  Wash thoroughly, paying special attention to the area where your surgery  will be performed.  7.  Thoroughly rinse your body with warm water from the neck down.  8.  DO NOT shower/wash with your normal soap after using and rinsing off  the CHG Soap.                9.  Pat yourself dry with a clean towel.            10.  Wear clean pajamas.            11.  Place clean sheets on your bed the night of your first shower and do not  sleep with pets. Day of Surgery : Do not apply any lotions/deodorants the morning of surgery.  Please wear clean clothes to the hospital/surgery center.  FAILURE TO FOLLOW THESE INSTRUCTIONS MAY RESULT IN THE CANCELLATION OF YOUR SURGERY PATIENT SIGNATURE_________________________________  NURSE SIGNATURE__________________________________  ________________________________________________________________________

## 2016-10-08 ENCOUNTER — Inpatient Hospital Stay (HOSPITAL_COMMUNITY)
Admission: RE | Admit: 2016-10-08 | Discharge: 2016-10-08 | Disposition: A | Discharge status: Home / Self Care | Payer: Commercial Managed Care - PPO | Source: Ambulatory Visit

## 2016-10-15 ENCOUNTER — Encounter (HOSPITAL_COMMUNITY)
Admission: RE | Admit: 2016-10-15 | Discharge: 2016-10-15 | Disposition: A | Discharge status: Home / Self Care | Payer: Commercial Managed Care - PPO | Source: Ambulatory Visit | Attending: Surgery | Admitting: Surgery

## 2016-10-15 ENCOUNTER — Encounter (HOSPITAL_COMMUNITY): Payer: Self-pay

## 2016-10-15 DIAGNOSIS — Z87891 Personal history of nicotine dependence: Secondary | ICD-10-CM | POA: Diagnosis not present

## 2016-10-15 DIAGNOSIS — K8010 Calculus of gallbladder with chronic cholecystitis without obstruction: Secondary | ICD-10-CM | POA: Diagnosis not present

## 2016-10-15 DIAGNOSIS — R932 Abnormal findings on diagnostic imaging of liver and biliary tract: Secondary | ICD-10-CM | POA: Diagnosis not present

## 2016-10-15 HISTORY — DX: Cholecystitis, unspecified: K81.9

## 2016-10-15 LAB — CBC
HEMATOCRIT: 39.9 % (ref 36.0–46.0)
Hemoglobin: 13.8 g/dL (ref 12.0–15.0)
MCH: 30.7 pg (ref 26.0–34.0)
MCHC: 34.6 g/dL (ref 30.0–36.0)
MCV: 88.9 fL (ref 78.0–100.0)
PLATELETS: 241 10*3/uL (ref 150–400)
RBC: 4.49 MIL/uL (ref 3.87–5.11)
RDW: 12.6 % (ref 11.5–15.5)
WBC: 6.4 10*3/uL (ref 4.0–10.5)

## 2016-10-15 NOTE — Patient Instructions (Signed)
Carrie Gray  10/15/2016   Your procedure is scheduled on: 10-17-16  Report to [facility] Main  Entrance Take [place]  elevators to 3rd floor to  Short Stay Center at 8:50AM.    Call this number if you have problems the morning of surgery [phone]   Remember: ONLY 1 PERSON MAY GO WITH YOU TO SHORT STAY TO GET  READY MORNING OF YOUR SURGERY.  Do not eat food or drink liquids :After Midnight.     Take these medicines the morning of surgery with A SIP OF WATER: none                                 You may not have any metal on your body including hair pins and              piercings  Do not wear jewelry, make-up, lotions, powders or perfumes, deodorant             Do not wear nail polish.  Do not shave  48 hours prior to surgery.                Do not bring valuables to the hospital. [place] IS NOT             RESPONSIBLE   FOR VALUABLES.  Contacts, dentures or bridgework may not be worn into surgery.      Patients discharged the day of surgery will not be allowed to drive home.  Name and phone number of your driver:  Special Instructions: N/A              Please read over the following fact sheets you were given: _____________________________________________________________________           [facility] - Preparing for Surgery Before surgery, you can play an important role.  Because skin is not sterile, your skin needs to be as free of germs as possible.  You can reduce the number of germs on your skin by washing with CHG (chlorahexidine gluconate) soap before surgery.  CHG is an antiseptic cleaner which kills germs and bonds with the skin to continue killing germs even after washing. Please DO NOT use if you have an allergy to CHG or antibacterial soaps.  If your skin becomes reddened/irritated stop using the CHG and inform your nurse when you arrive at Short Stay. Do not shave (including legs and underarms) for at least 48 hours prior to the  first CHG shower.  You may shave your face/neck. Please follow these instructions carefully:  1.  Shower with CHG Soap the night before surgery and the  morning of Surgery.  2.  If you choose to wash your hair, wash your hair first as usual with your  normal  shampoo.  3.  After you shampoo, rinse your hair and body thoroughly to remove the  shampoo.                           4.  Use CHG as you would any other liquid soap.  You can apply chg directly  to the skin and wash                       Gently with a scrungie or clean washcloth.  5.  Apply the CHG Soap to your body ONLY FROM THE NECK DOWN.   Do not use on face/ open                           Wound or open sores. Avoid contact with eyes, ears mouth and genitals (private parts).                       Wash face,  Genitals (private parts) with your normal soap.             6.  Wash thoroughly, paying special attention to the area where your surgery  will be performed.  7.  Thoroughly rinse your body with warm water from the neck down.  8.  DO NOT shower/wash with your normal soap after using and rinsing off  the CHG Soap.                9.  Pat yourself dry with a clean towel.            10.  Wear clean pajamas.            11.  Place clean sheets on your bed the night of your first shower and do not  sleep with pets. Day of Surgery : Do not apply any lotions/deodorants the morning of surgery.  Please wear clean clothes to the hospital/surgery center.  FAILURE TO FOLLOW THESE INSTRUCTIONS MAY RESULT IN THE CANCELLATION OF YOUR SURGERY PATIENT SIGNATURE_________________________________  NURSE SIGNATURE__________________________________  ________________________________________________________________________

## 2016-10-16 NOTE — H&P (Signed)
Carrie Gray 09/12/2016 1:38 PM Location: [place] Office Patient #: [id number] DOB: 07-13-1965 Married / Language: [place] / Race: White Female   History of Present Illness Molli Hazard B. Daphine Deutscher MD; 09/12/2016 2:11 PM) The patient is a 51 year old female who presents for evaluation of gall stones. She was referred by Dr. Ruthe Mannan at [facility]. She has had recurrent bouts of RUQ and midepigastric pain that all have resolved. Ultrasound shows multiple gallstones up to 1 cm in diameter and possible adenomyomatosis. I discussed lap chole with her and her husband in detail including complications not limited to Common bile duct injury, bleeding, bile leaks and bowel injury. We also gave her cranes book on cholecystectomy. They seem to understand the indications and risks and she wants to have this done at [place] [facility]. There are just selling house and moving to another house and despite that they wanted going get this done within the next few weeks.   Past Surgical History Doristine Devoid, CMA; 09/12/2016 1:41 PM) No Surgeries   Allergies Doristine Devoid, CMA; 09/12/2016 1:39 PM) No Known Drug Allergies 09/12/2016  Medication History Doristine Devoid, CMA; 09/12/2016 1:39 PM) No Current Medications Medications Reconciled  Pregnancy / Birth History Doristine Devoid, CMA; 09/12/2016 1:40 PM) Age at menarche  14 years. Age of menopause  18-50. Pregnancies ([place])  3. Deliveries ([place])  3. Mammogram  1-3 years ago. Pap smear  1-5 years ago.  Vitals (Chemira Jones CMA; 09/12/2016 1:38 PM) 09/12/2016 1:38 PM Weight: 174 lb Height: 63.5in Body Surface Area: 1.83 m Body Mass Index: 30.34 kg/m  Pulse: 71 (Regular)  BP: 110/70 (Sitting, Left Arm, Standard)       Physical Exam (Vanecia Limpert B. Daphine Deutscher MD; 09/12/2016 2:12 PM) The physical exam findings are as follows: Note:[id number] HEENT unremarkable Neck supple Chest clear Heart SR without murmurs Abdomen  without scars Ext no hx of DVT Neuro alert and orient x 3    Assessment & Plan Molli Hazard B. Daphine Deutscher MD; 09/12/2016 2:13 PM) CHRONIC CHOLECYSTITIS WITH CALCULUS (K80.10) Impression: Plan lap chole at [place] as an outpatient.

## 2016-10-17 ENCOUNTER — Encounter (HOSPITAL_COMMUNITY)
Admission: RE | Disposition: A | Discharge status: Home / Self Care | Payer: Self-pay | Source: Ambulatory Visit | Attending: Surgery

## 2016-10-17 ENCOUNTER — Encounter (HOSPITAL_COMMUNITY): Payer: Self-pay | Admitting: Emergency Medicine

## 2016-10-17 ENCOUNTER — Ambulatory Visit (HOSPITAL_COMMUNITY): Payer: Commercial Managed Care - PPO | Admitting: Anesthesiology

## 2016-10-17 ENCOUNTER — Ambulatory Visit (HOSPITAL_COMMUNITY): Payer: Commercial Managed Care - PPO

## 2016-10-17 ENCOUNTER — Observation Stay (HOSPITAL_COMMUNITY)
Admission: RE | Admit: 2016-10-17 | Discharge: 2016-10-19 | Disposition: A | Discharge status: Home / Self Care | Payer: Commercial Managed Care - PPO | Source: Ambulatory Visit | Attending: Surgery | Admitting: Surgery

## 2016-10-17 DIAGNOSIS — K831 Obstruction of bile duct: Secondary | ICD-10-CM

## 2016-10-17 DIAGNOSIS — K8010 Calculus of gallbladder with chronic cholecystitis without obstruction: Secondary | ICD-10-CM | POA: Diagnosis not present

## 2016-10-17 DIAGNOSIS — K811 Chronic cholecystitis: Secondary | ICD-10-CM | POA: Diagnosis present

## 2016-10-17 DIAGNOSIS — Z419 Encounter for procedure for purposes other than remedying health state, unspecified: Secondary | ICD-10-CM

## 2016-10-17 DIAGNOSIS — R932 Abnormal findings on diagnostic imaging of liver and biliary tract: Secondary | ICD-10-CM | POA: Insufficient documentation

## 2016-10-17 DIAGNOSIS — Z87891 Personal history of nicotine dependence: Secondary | ICD-10-CM | POA: Insufficient documentation

## 2016-10-17 HISTORY — PX: CHOLECYSTECTOMY: SHX55

## 2016-10-17 LAB — CBC
HEMATOCRIT: 39.4 % (ref 36.0–46.0)
Hemoglobin: 13.5 g/dL (ref 12.0–15.0)
MCH: 30.5 pg (ref 26.0–34.0)
MCHC: 34.3 g/dL (ref 30.0–36.0)
MCV: 88.9 fL (ref 78.0–100.0)
Platelets: 202 10*3/uL (ref 150–400)
RBC: 4.43 MIL/uL (ref 3.87–5.11)
RDW: 12.6 % (ref 11.5–15.5)
WBC: 12.7 10*3/uL — AB (ref 4.0–10.5)

## 2016-10-17 LAB — CREATININE, SERUM
Creatinine, Ser: 0.92 mg/dL (ref 0.44–1.00)
GFR calc non Af Amer: >60 mL/min (ref >60)

## 2016-10-17 LAB — HCG, SERUM, QUALITATIVE: PREG SERUM: NEGATIVE

## 2016-10-17 SURGERY — LAPAROSCOPIC CHOLECYSTECTOMY WITH INTRAOPERATIVE CHOLANGIOGRAM
Anesthesia: General

## 2016-10-17 MED ORDER — LACTATED RINGERS IR SOLN
Status: DC | PRN
  Administered 2016-10-17 (×2): 1000 mL

## 2016-10-17 MED ORDER — ONDANSETRON HCL 4 MG/2ML IJ SOLN
INTRAMUSCULAR | Status: DC | PRN
  Administered 2016-10-17: 4 mg via INTRAVENOUS

## 2016-10-17 MED ORDER — DEXAMETHASONE SODIUM PHOSPHATE 10 MG/ML IJ SOLN
INTRAMUSCULAR | Status: DC | PRN
  Administered 2016-10-17: 10 mg via INTRAVENOUS

## 2016-10-17 MED ORDER — SUGAMMADEX SODIUM 500 MG/5ML IV SOLN
INTRAVENOUS | Status: AC
  Filled 2016-10-17: qty 5

## 2016-10-17 MED ORDER — PROPOFOL 10 MG/ML IV BOLUS
INTRAVENOUS | Status: DC | PRN
  Administered 2016-10-17: 20 mg via INTRAVENOUS
  Administered 2016-10-17: 150 mg via INTRAVENOUS

## 2016-10-17 MED ORDER — ONDANSETRON HCL 4 MG/2ML IJ SOLN
INTRAMUSCULAR | Status: AC
  Filled 2016-10-17: qty 2

## 2016-10-17 MED ORDER — CELECOXIB 200 MG PO CAPS
200.0000 mg | ORAL_CAPSULE | ORAL | Status: AC
  Administered 2016-10-17: 200 mg via ORAL
  Filled 2016-10-17: qty 1

## 2016-10-17 MED ORDER — DEXAMETHASONE SODIUM PHOSPHATE 10 MG/ML IJ SOLN
INTRAMUSCULAR | Status: AC
  Filled 2016-10-17: qty 1

## 2016-10-17 MED ORDER — SUGAMMADEX SODIUM 200 MG/2ML IV SOLN
INTRAVENOUS | Status: DC | PRN
  Administered 2016-10-17: 200 mg via INTRAVENOUS

## 2016-10-17 MED ORDER — ONDANSETRON HCL 4 MG/2ML IJ SOLN
4.0000 mg | Freq: Four times a day (QID) | INTRAMUSCULAR | Status: DC | PRN
  Administered 2016-10-17: 4 mg via INTRAVENOUS
  Filled 2016-10-17: qty 2

## 2016-10-17 MED ORDER — SUCCINYLCHOLINE CHLORIDE 200 MG/10ML IV SOSY
PREFILLED_SYRINGE | INTRAVENOUS | Status: AC
  Filled 2016-10-17: qty 10

## 2016-10-17 MED ORDER — SUCCINYLCHOLINE CHLORIDE 200 MG/10ML IV SOSY
PREFILLED_SYRINGE | INTRAVENOUS | Status: DC | PRN
  Administered 2016-10-17: 100 mg via INTRAVENOUS

## 2016-10-17 MED ORDER — IOPAMIDOL (ISOVUE-300) INJECTION 61%
INTRAVENOUS | Status: DC | PRN
  Administered 2016-10-17: 7 mL

## 2016-10-17 MED ORDER — MORPHINE SULFATE (PF) 2 MG/ML IV SOLN
1.0000 mg | INTRAVENOUS | Status: DC | PRN
  Administered 2016-10-17: 1 mg via INTRAVENOUS
  Filled 2016-10-17: qty 1

## 2016-10-17 MED ORDER — MIDAZOLAM HCL 2 MG/2ML IJ SOLN
INTRAMUSCULAR | Status: AC
  Filled 2016-10-17: qty 2

## 2016-10-17 MED ORDER — BUPIVACAINE-EPINEPHRINE 0.25% -1:200000 IJ SOLN
INTRAMUSCULAR | Status: DC | PRN
  Administered 2016-10-17: 30 mL

## 2016-10-17 MED ORDER — MEPERIDINE HCL 50 MG/ML IJ SOLN: 6.2500 mg | INTRAMUSCULAR | Status: DC | PRN

## 2016-10-17 MED ORDER — BUPIVACAINE-EPINEPHRINE (PF) 0.25% -1:200000 IJ SOLN
INTRAMUSCULAR | Status: AC
  Filled 2016-10-17: qty 30

## 2016-10-17 MED ORDER — LIDOCAINE 2% (20 MG/ML) 5 ML SYRINGE
INTRAMUSCULAR | Status: AC
  Filled 2016-10-17: qty 5

## 2016-10-17 MED ORDER — DEXTROSE 5 % IV SOLN
2.0000 g | Freq: Two times a day (BID) | INTRAVENOUS | Status: DC
  Administered 2016-10-17 – 2016-10-19 (×3): 2 g via INTRAVENOUS
  Filled 2016-10-17 (×4): qty 2

## 2016-10-17 MED ORDER — CIPROFLOXACIN IN D5W 400 MG/200ML IV SOLN
400.0000 mg | INTRAVENOUS | Status: AC
  Administered 2016-10-18: 400 mg via INTRAVENOUS
  Filled 2016-10-17: qty 200

## 2016-10-17 MED ORDER — ACETAMINOPHEN 500 MG PO TABS
1000.0000 mg | ORAL_TABLET | ORAL | Status: AC
  Administered 2016-10-17: 1000 mg via ORAL
  Filled 2016-10-17: qty 2

## 2016-10-17 MED ORDER — HEPARIN SODIUM (PORCINE) 5000 UNIT/ML IJ SOLN
5000.0000 [IU] | Freq: Three times a day (TID) | INTRAMUSCULAR | Status: DC
  Administered 2016-10-17 – 2016-10-19 (×3): 5000 [IU] via SUBCUTANEOUS
  Filled 2016-10-17 (×3): qty 1

## 2016-10-17 MED ORDER — FENTANYL CITRATE (PF) 100 MCG/2ML IJ SOLN
INTRAMUSCULAR | Status: DC | PRN
  Administered 2016-10-17 (×2): 50 ug via INTRAVENOUS

## 2016-10-17 MED ORDER — LIDOCAINE 2% (20 MG/ML) 5 ML SYRINGE
INTRAMUSCULAR | Status: DC | PRN
  Administered 2016-10-17: 100 mg via INTRAVENOUS

## 2016-10-17 MED ORDER — GABAPENTIN 300 MG PO CAPS
300.0000 mg | ORAL_CAPSULE | ORAL | Status: AC
  Administered 2016-10-17: 300 mg via ORAL
  Filled 2016-10-17: qty 1

## 2016-10-17 MED ORDER — BUPIVACAINE LIPOSOME 1.3 % IJ SUSP
INTRAMUSCULAR | Status: DC | PRN
  Administered 2016-10-17: 20 mL

## 2016-10-17 MED ORDER — KCL IN DEXTROSE-NACL 20-5-0.45 MEQ/L-%-% IV SOLN
INTRAVENOUS | Status: DC
  Administered 2016-10-17 – 2016-10-18 (×2): via INTRAVENOUS
  Administered 2016-10-18: 1000 mL via INTRAVENOUS
  Administered 2016-10-19: 03:00:00 via INTRAVENOUS
  Filled 2016-10-17 (×5): qty 1000

## 2016-10-17 MED ORDER — ROCURONIUM BROMIDE 50 MG/5ML IV SOSY
PREFILLED_SYRINGE | INTRAVENOUS | Status: AC
  Filled 2016-10-17: qty 5

## 2016-10-17 MED ORDER — PROPOFOL 10 MG/ML IV BOLUS
INTRAVENOUS | Status: AC
  Filled 2016-10-17: qty 20

## 2016-10-17 MED ORDER — FENTANYL CITRATE (PF) 100 MCG/2ML IJ SOLN
25.0000 ug | INTRAMUSCULAR | Status: DC | PRN
  Administered 2016-10-17 (×2): 50 ug via INTRAVENOUS

## 2016-10-17 MED ORDER — FENTANYL CITRATE (PF) 100 MCG/2ML IJ SOLN
INTRAMUSCULAR | Status: AC
  Filled 2016-10-17: qty 2

## 2016-10-17 MED ORDER — LACTATED RINGERS IV SOLN
INTRAVENOUS | Status: DC
  Administered 2016-10-17 (×2): via INTRAVENOUS

## 2016-10-17 MED ORDER — IOPAMIDOL (ISOVUE-300) INJECTION 61%
INTRAVENOUS | Status: AC
  Filled 2016-10-17: qty 50

## 2016-10-17 MED ORDER — CHLORHEXIDINE GLUCONATE CLOTH 2 % EX PADS: 6.0000 | MEDICATED_PAD | Freq: Once | CUTANEOUS | Status: DC

## 2016-10-17 MED ORDER — PANTOPRAZOLE SODIUM 40 MG IV SOLR
40.0000 mg | Freq: Every day | INTRAVENOUS | Status: DC
  Administered 2016-10-17 – 2016-10-18 (×2): 40 mg via INTRAVENOUS
  Filled 2016-10-17 (×2): qty 40

## 2016-10-17 MED ORDER — HYDROCODONE-ACETAMINOPHEN 5-325 MG PO TABS: 1.0000 | ORAL_TABLET | ORAL | Status: DC | PRN

## 2016-10-17 MED ORDER — ONDANSETRON 4 MG PO TBDP: 4.0000 mg | ORAL_TABLET | Freq: Four times a day (QID) | ORAL | Status: DC | PRN

## 2016-10-17 MED ORDER — HEPARIN SODIUM (PORCINE) 5000 UNIT/ML IJ SOLN
5000.0000 [IU] | Freq: Once | INTRAMUSCULAR | Status: AC
  Administered 2016-10-17: 5000 [IU] via SUBCUTANEOUS
  Filled 2016-10-17: qty 1

## 2016-10-17 MED ORDER — SUGAMMADEX SODIUM 200 MG/2ML IV SOLN
INTRAVENOUS | Status: AC
  Filled 2016-10-17: qty 2

## 2016-10-17 MED ORDER — MIDAZOLAM HCL 5 MG/5ML IJ SOLN
INTRAMUSCULAR | Status: DC | PRN
  Administered 2016-10-17: 2 mg via INTRAVENOUS

## 2016-10-17 MED ORDER — HYDRALAZINE HCL 20 MG/ML IJ SOLN: 10.0000 mg | INTRAMUSCULAR | Status: DC | PRN

## 2016-10-17 MED ORDER — FENTANYL CITRATE (PF) 100 MCG/2ML IJ SOLN
INTRAMUSCULAR | Status: AC
  Filled 2016-10-17: qty 4

## 2016-10-17 MED ORDER — CEFAZOLIN SODIUM-DEXTROSE 2-4 GM/100ML-% IV SOLN
2.0000 g | INTRAVENOUS | Status: AC
  Administered 2016-10-17: 2 g via INTRAVENOUS
  Filled 2016-10-17: qty 100

## 2016-10-17 MED ORDER — ONDANSETRON HCL 4 MG/2ML IJ SOLN: 4.0000 mg | Freq: Once | INTRAMUSCULAR | Status: DC | PRN

## 2016-10-17 MED ORDER — ROCURONIUM BROMIDE 50 MG/5ML IV SOSY
PREFILLED_SYRINGE | INTRAVENOUS | Status: DC | PRN
  Administered 2016-10-17: 40 mg via INTRAVENOUS
  Administered 2016-10-17: 10 mg via INTRAVENOUS

## 2016-10-17 SURGICAL SUPPLY — 42 items
ADH SKN CLS APL DERMABOND .7 (GAUZE/BANDAGES/DRESSINGS) ×1
APL SKNCLS STERI-STRIP NONHPOA (GAUZE/BANDAGES/DRESSINGS)
APPLICATOR COTTON TIP 6IN STRL (MISCELLANEOUS) ×4 IMPLANT
APPLIER CLIP 5 13 M/L LIGAMAX5 (MISCELLANEOUS) ×2
APPLIER CLIP ROT 10 11.4 M/L (STAPLE) ×2
APR CLP MED LRG 11.4X10 (STAPLE) ×1
APR CLP MED LRG 5 ANG JAW (MISCELLANEOUS) ×1
BAG SPEC RTRVL 10 TROC 200 (ENDOMECHANICALS)
BENZOIN TINCTURE PRP APPL 2/3 (GAUZE/BANDAGES/DRESSINGS) IMPLANT
CABLE HIGH FREQUENCY MONO STRZ (ELECTRODE) ×2 IMPLANT
CATH REDDICK CHOLANGI 4FR 50CM (CATHETERS) ×2 IMPLANT
CLIP APPLIE 5 13 M/L LIGAMAX5 (MISCELLANEOUS) IMPLANT
CLIP APPLIE ROT 10 11.4 M/L (STAPLE) ×1 IMPLANT
COVER MAYO STAND STRL (DRAPES) ×2 IMPLANT
COVER SURGICAL LIGHT HANDLE (MISCELLANEOUS) ×2 IMPLANT
DECANTER SPIKE VIAL GLASS SM (MISCELLANEOUS) ×2 IMPLANT
DERMABOND ADVANCED (GAUZE/BANDAGES/DRESSINGS) ×1
DERMABOND ADVANCED .7 DNX12 (GAUZE/BANDAGES/DRESSINGS) ×1 IMPLANT
DRAPE C-ARM 42X120 X-RAY (DRAPES) ×2 IMPLANT
ELECT PENCIL ROCKER SW 15FT (MISCELLANEOUS) IMPLANT
ELECT REM PT RETURN 15FT ADLT (MISCELLANEOUS) ×2 IMPLANT
GLOVE BIOGEL M 8.0 STRL (GLOVE) ×2 IMPLANT
GOWN STRL REUS W/TWL XL LVL3 (GOWN DISPOSABLE) ×8 IMPLANT
HEMOSTAT SURGICEL 4X8 (HEMOSTASIS) IMPLANT
IV CATH 14GX2 1/4 (CATHETERS) ×2 IMPLANT
KIT BASIN OR (CUSTOM PROCEDURE TRAY) ×2 IMPLANT
L-HOOK LAP DISP 36CM (ELECTROSURGICAL)
LHOOK LAP DISP 36CM (ELECTROSURGICAL) IMPLANT
POUCH RETRIEVAL ECOSAC 10 (ENDOMECHANICALS) IMPLANT
POUCH RETRIEVAL ECOSAC 10MM (ENDOMECHANICALS)
SCISSORS LAP 5X45 EPIX DISP (ENDOMECHANICALS) ×2 IMPLANT
SET IRRIG TUBING LAPAROSCOPIC (IRRIGATION / IRRIGATOR) ×2 IMPLANT
SLEEVE XCEL OPT CAN 5 100 (ENDOMECHANICALS) ×2 IMPLANT
STRIP CLOSURE SKIN 1/2X4 (GAUZE/BANDAGES/DRESSINGS) IMPLANT
SUT MNCRL AB 4-0 PS2 18 (SUTURE) ×2 IMPLANT
SYR 20CC LL (SYRINGE) ×2 IMPLANT
TOWEL OR 17X26 10 PK STRL BLUE (TOWEL DISPOSABLE) ×2 IMPLANT
TRAY LAPAROSCOPIC (CUSTOM PROCEDURE TRAY) ×2 IMPLANT
TROCAR BLADELESS OPT 5 100 (ENDOMECHANICALS) ×2 IMPLANT
TROCAR XCEL BLUNT TIP 100MML (ENDOMECHANICALS) IMPLANT
TROCAR XCEL NON-BLD 11X100MML (ENDOMECHANICALS) ×2 IMPLANT
TUBING INSUF HEATED (TUBING) ×2 IMPLANT

## 2016-10-17 NOTE — Transfer of Care (Signed)
Immediate Anesthesia Transfer of Care Note  Patient: Carrie Gray  Procedure(s) Performed: LAPAROSCOPIC CHOLECYSTECTOMY WITH INTRAOPERATIVE CHOLANGIOGRAM (N/A )  Patient Location: PACU  Anesthesia Type:General  Level of Consciousness: sedated  Airway & Oxygen Therapy: Patient Spontanous Breathing and Patient connected to face mask oxygen  Post-op Assessment: Report given to RN and Post -op Vital signs reviewed and stable  Post vital signs: Reviewed and stable  Last Vitals:  Vitals:   10/17/16 0857  BP: 113/75  Pulse: 70  Resp: 16  Temp: 36.7 C  SpO2: 99%    Last Pain:  Vitals:   10/17/16 0857  TempSrc: Oral      Patients Stated Pain Goal: 4 (10/17/16 0916)  Complications: No apparent anesthesia complications

## 2016-10-17 NOTE — Progress Notes (Signed)
Pt in PACU at 1255

## 2016-10-17 NOTE — Op Note (Signed)
Carrie Gray   10/17/2016  12:55 PM  Procedure: Laparoscopic Cholecystectomy with intraoperative cholangiogram  Surgeon: Susy Frizzle B. Daphine Deutscher, MD, FACS Asst:  Ovidio Kin, MD, FACS  Anes:  General  Drains:  None  Findings: Moderate severe chronic cholecystitis (duodenum stuck to infundibulum); multiple small stones and large stones; IOC-small CBD stones  Description of Procedure: The patient was taken to OR 4 and given general anesthesia.  The patient was prepped with Technicare and draped sterilely. A time out was performed.  Access to the abdomen was achieved with Hasson in umbilicus. Marland Kitchen  Port placement included three other 5 mm trocars.    The gallbladder was visualized ( it was white and stuck to the surrounding omentum-this was taken down sharply) and the fundus was grasped and the gallbladder was elevated.  Adhesions to the duodenum were taken down sharply. Traction on the infundibulum allowed for successful demonstration of the critical view. Inflammatory changes were chronic.  The cystic duct was identified and clipped up on the gallbladder and an incision was made in the cystic duct and the Reddick catheter was inserted after milking the cystic duct of any debris. A dynamic cholangiogram was performed which demonstrated multiple small stones but flow into the duodenum.    The cystic duct was then triple clipped and divided, the cystic artery was double clipped and divided and then the gallbladder was removed from the gallbladder bed. Removal of the gallbladder from the gallbladder bed was straight forward.  The gallbladder was then placed in a bag and brought out through one of the trocar sites. The gallbladder bed was inspected and no bleeding or bile leaks were seen.   Laparoscopic visualization was used when closing fascial defects for trocar sites.   Incisions were injected with Exparel and closed with 4-0 Vicryl and Monocryl on the skin.  Sponge and needle count were correct.    The  patient was taken to the recovery room in satisfactory condition.

## 2016-10-17 NOTE — Anesthesia Procedure Notes (Signed)
Procedure Name: Intubation Date/Time: 10/17/2016 10:57 AM Performed by: Montel Clock Pre-anesthesia Checklist: Patient identified, Emergency Drugs available, Suction available, Patient being monitored and Timeout performed Patient Re-evaluated:Patient Re-evaluated prior to induction Oxygen Delivery Method: Circle system utilized Preoxygenation: Pre-oxygenation with 100% oxygen Induction Type: IV induction Ventilation: Mask ventilation without difficulty Laryngoscope Size: Mac and 3 Grade View: Grade II Tube type: Oral Tube size: 7.0 mm Number of attempts: 1 Airway Equipment and Method: Stylet Placement Confirmation: ETT inserted through vocal cords under direct vision,  positive ETCO2 and breath sounds checked- equal and bilateral Secured at: 21 cm Tube secured with: Tape Dental Injury: Teeth and Oropharynx as per pre-operative assessment

## 2016-10-17 NOTE — Interval H&P Note (Signed)
History and Physical Interval Note:  10/17/2016 10:39 AM  Carrie Gray  has presented today for surgery, with the diagnosis of CHRONIC CHOLECYSTITIS   The various methods of treatment have been discussed with the patient and family. After consideration of risks, benefits and other options for treatment, the patient has consented to  Procedure(s): LAPAROSCOPIC CHOLECYSTECTOMY WITH INTRAOPERATIVE CHOLANGIOGRAM (N/A) as a surgical intervention .  The patient's history has been reviewed, patient examined, no change in status, stable for surgery.  I have reviewed the patient's chart and labs.  Questions were answered to the patient's satisfaction.     Kerria Sapien B

## 2016-10-17 NOTE — Consult Note (Signed)
[facility] CON[facility] Reason for consult: CBD stone  Referring Physician: Dr Wenda Low  Carrie Gray is an 51 y.o. female.  HPI: she just had a laparoscopic cholecystectomy today by Dr. Daphine Deutscher 4 call stones on ultrasound. IOC showed multiple small filling defects in the CBD consistent with CBD stones. We do not have any current LFTs. The patient is not having any fever and is waking up following her cholecystectomy. She is alert and oriented and answers questions appropriately. She denies much pain.  Past Medical History:  Diagnosis Date  . Cholecystitis     Past Surgical History:  Procedure Laterality Date  . GANGLION CYST EXCISION Right     Family History  Problem Relation Age of Onset  . Cancer Mother 52       colon    Social History:  reports that she has quit smoking. She has never used smokeless tobacco. She reports that she drinks alcohol. She reports that she does not use drugs.  Allergies: No Known Allergies  Medications; Prior to Admission medications   Medication Sig Start Date End Date Taking? Authorizing Provider  ibuprofen (ADVIL,MOTRIN) 200 MG tablet Take 400-800 mg by mouth every 6 (six) hours as needed for headache or mild pain (2-4 tablets depending on pain).   Yes [provider]  latanoprost (XALATAN) 0.005 % ophthalmic solution Place 1 drop into both eyes at bedtime.   Yes [provider]   . fentaNYL       PRN Meds  Results for orders placed or performed during the hospital encounter of 10/17/16 (from the past 48 hour(s))  hCG, serum, qualitative     Status: None   Collection Time: 10/17/16  9:10 AM  Result Value Ref Range   Preg, Serum NEGATIVE NEGATIVE    Comment:        THE SENSITIVITY OF THIS METHODOLOGY IS >10 mIU/mL.     Dg Cholangiogram Operative  Result Date: 10/17/2016 CLINICAL DATA:  Cholelithiasis EXAM: INTRAOPERATIVE CHOLANGIOGRAM TECHNIQUE: Cholangiographic images from the C-arm fluoroscopic device  were submitted for interpretation post-operatively. Please see the procedural report for the amount of contrast and the fluoroscopy time utilized. COMPARISON:  09/10/2016 FINDINGS: Intraoperative cholangiogram performed during the laparoscopic procedure. Several small round filling defects in the common bile duct distally with diffuse mild biliary dilatation. Eventually, there is flow demonstrated into the duodenum IMPRESSION: Positive exam for choledocholithiasis and associated biliary dilatation. Electronically Signed   By: Judie Petit.  Shick M.D.   On: 10/17/2016 13:15               Blood pressure 132/83, pulse 69, temperature 98.2 F (36.8 C), resp. rate 12, height  (1.626 m), weight 78 kg (172 lb), last menstrual period 10/14/2016, SpO2 100 %.  Physical exam:   General-- pleasant white female no acute distress alert and oriented ENT-- nonicteric Neck-- supple Heart-- regular rate and rhythm without murmurs are gallops Lungs-- clear Abdomen-- somewhat quiet mildly tender    Assessment: 1. CBD stones.  Plan: 1. Have discussed with patient and will discuss with her husband as well. Will proceed with ERCP and stone extraction tomorrow. This will be performed by Dr. Dulce Sellar at [phone]. Cleon Dew would have to add on in the late afternoon so will transport to [facility] for the procedure at 1130 by Dr. Dulce Sellar. We will arrange [facility] to transport her to [facility], endoscopy to arrive by 10 AM. Extensively discussed the procedure of ERCP, sphincterotomy, and stone extraction with patient  and the risk of pancreatitis bleeding etc. discuss with her. Orders written for the procedure.   Burel Kahre JR,Salley Boxley L 10/17/2016, 2:33 PM   This note was created using voice recognition software and minor errors may Have occurred unintentionally. Pager: [phone] If no answer or after hours call [phone]

## 2016-10-17 NOTE — Anesthesia Postprocedure Evaluation (Signed)
Anesthesia Post Note  Patient: Carrie Gray  Procedure(s) Performed: LAPAROSCOPIC CHOLECYSTECTOMY WITH INTRAOPERATIVE CHOLANGIOGRAM (N/A )     Patient location during evaluation: PACU Anesthesia Type: General Level of consciousness: awake and alert Pain management: pain level controlled Vital Signs Assessment: post-procedure vital signs reviewed and stable Respiratory status: spontaneous breathing, nonlabored ventilation, respiratory function stable and patient connected to nasal cannula oxygen Cardiovascular status: blood pressure returned to baseline and stable Postop Assessment: no apparent nausea or vomiting Anesthetic complications: no    Last Vitals:  Vitals:   10/17/16 1345 10/17/16 1400  BP: 127/84   Pulse: 77 (!) 56  Resp: (!) 21 12  Temp:    SpO2: 100% 100%    Last Pain:  Vitals:   10/17/16 1345  TempSrc:   PainSc: 4                  Wyley Hack

## 2016-10-17 NOTE — Anesthesia Preprocedure Evaluation (Signed)
Anesthesia Evaluation  Patient identified by MRN, date of birth, ID band Patient awake    Reviewed: Allergy & Precautions, NPO status , Patient's Chart, lab work & pertinent test results  Airway Mallampati: II  TM Distance: >3 FB Neck ROM: Full    Dental no notable dental hx.    Pulmonary neg pulmonary ROS, former smoker,    Pulmonary exam normal breath sounds clear to auscultation       Cardiovascular negative cardio ROS Normal cardiovascular exam Rhythm:Regular Rate:Normal     Neuro/Psych negative neurological ROS  negative psych ROS   GI/Hepatic negative GI ROS, Neg liver ROS,   Endo/Other  negative endocrine ROS  Renal/GU negative Renal ROS  negative genitourinary   Musculoskeletal negative musculoskeletal ROS (+)   Abdominal   Peds negative pediatric ROS (+)  Hematology negative hematology ROS (+)   Anesthesia Other Findings   Reproductive/Obstetrics negative OB ROS                             Anesthesia Physical Anesthesia Plan  ASA: II  Anesthesia Plan: General   Post-op Pain Management:    Induction: Intravenous  PONV Risk Score and Plan: 2 and 3 and Ondansetron, Dexamethasone, Treatment may vary due to age or medical condition and Midazolam  Airway Management Planned: Oral ETT  Additional Equipment:   Intra-op Plan:   Post-operative Plan: Extubation in OR  Informed Consent:   Plan Discussed with:   Anesthesia Plan Comments: (  )        Anesthesia Quick Evaluation

## 2016-10-18 ENCOUNTER — Encounter (HOSPITAL_COMMUNITY): Payer: Self-pay | Admitting: *Deleted

## 2016-10-18 ENCOUNTER — Observation Stay (HOSPITAL_COMMUNITY): Payer: Commercial Managed Care - PPO | Admitting: Anesthesiology

## 2016-10-18 ENCOUNTER — Encounter (HOSPITAL_COMMUNITY)
Admission: RE | Disposition: A | Discharge status: Home / Self Care | Payer: Self-pay | Source: Ambulatory Visit | Attending: Surgery

## 2016-10-18 ENCOUNTER — Observation Stay (HOSPITAL_COMMUNITY): Payer: Commercial Managed Care - PPO

## 2016-10-18 DIAGNOSIS — K8010 Calculus of gallbladder with chronic cholecystitis without obstruction: Secondary | ICD-10-CM | POA: Diagnosis not present

## 2016-10-18 HISTORY — PX: ENDOSCOPIC RETROGRADE CHOLANGIOPANCREATOGRAPHY (ERCP) WITH PROPOFOL: SHX5810

## 2016-10-18 LAB — CBC WITH DIFFERENTIAL/PLATELET
BASOS PCT: 0 %
Basophils Absolute: 0 10*3/uL (ref 0.0–0.1)
EOS ABS: 0 10*3/uL (ref 0.0–0.7)
Eosinophils Relative: 0 %
HCT: 36.9 % (ref 36.0–46.0)
HEMOGLOBIN: 12.6 g/dL (ref 12.0–15.0)
LYMPHS ABS: 1.1 10*3/uL (ref 0.7–4.0)
Lymphocytes Relative: 9 %
MCH: 30.4 pg (ref 26.0–34.0)
MCHC: 34.1 g/dL (ref 30.0–36.0)
MCV: 89.1 fL (ref 78.0–100.0)
Monocytes Absolute: 0.7 10*3/uL (ref 0.1–1.0)
Monocytes Relative: 6 %
NEUTROS PCT: 85 %
Neutro Abs: 11 10*3/uL — ABNORMAL HIGH (ref 1.7–7.7)
Platelets: 209 10*3/uL (ref 150–400)
RBC: 4.14 MIL/uL (ref 3.87–5.11)
RDW: 12.6 % (ref 11.5–15.5)
WBC: 12.9 10*3/uL — AB (ref 4.0–10.5)

## 2016-10-18 LAB — COMPREHENSIVE METABOLIC PANEL
ALT: 111 U/L — AB (ref 14–54)
ANION GAP: 7 (ref 5–15)
AST: 88 U/L — ABNORMAL HIGH (ref 15–41)
Albumin: 3.5 g/dL (ref 3.5–5.0)
Alkaline Phosphatase: 57 U/L (ref 38–126)
BUN: 8 mg/dL (ref 6–20)
CALCIUM: 8.2 mg/dL — AB (ref 8.9–10.3)
CHLORIDE: 108 mmol/L (ref 101–111)
CO2: 24 mmol/L (ref 22–32)
CREATININE: 0.91 mg/dL (ref 0.44–1.00)
Glucose, Bld: 149 mg/dL — ABNORMAL HIGH (ref 65–99)
Potassium: 4.5 mmol/L (ref 3.5–5.1)
Sodium: 139 mmol/L (ref 135–145)
Total Bilirubin: 0.3 mg/dL (ref 0.3–1.2)
Total Protein: 6.4 g/dL — ABNORMAL LOW (ref 6.5–8.1)

## 2016-10-18 SURGERY — ERCP, WITH INTERVENTION IF INDICATED
Anesthesia: General

## 2016-10-18 SURGERY — ENDOSCOPIC RETROGRADE CHOLANGIOPANCREATOGRAPHY (ERCP) WITH PROPOFOL
Anesthesia: General

## 2016-10-18 MED ORDER — SUCCINYLCHOLINE CHLORIDE 20 MG/ML IJ SOLN
INTRAMUSCULAR | Status: DC | PRN
  Administered 2016-10-18: 100 mg via INTRAVENOUS

## 2016-10-18 MED ORDER — LIDOCAINE HCL (CARDIAC) 20 MG/ML IV SOLN
INTRAVENOUS | Status: DC | PRN
  Administered 2016-10-18: 50 mg via INTRAVENOUS

## 2016-10-18 MED ORDER — INDOMETHACIN 50 MG RE SUPP
RECTAL | Status: DC | PRN
  Administered 2016-10-18: 100 mg via RECTAL

## 2016-10-18 MED ORDER — INDOMETHACIN 50 MG RE SUPP
RECTAL | Status: AC
  Filled 2016-10-18: qty 2

## 2016-10-18 MED ORDER — MIDAZOLAM HCL 5 MG/5ML IJ SOLN
INTRAMUSCULAR | Status: DC | PRN
  Administered 2016-10-18: 2 mg via INTRAVENOUS

## 2016-10-18 MED ORDER — SODIUM CHLORIDE 0.9 % IV SOLN: INTRAVENOUS | Status: DC

## 2016-10-18 MED ORDER — ONDANSETRON HCL 4 MG/2ML IJ SOLN
INTRAMUSCULAR | Status: DC | PRN
  Administered 2016-10-18: 4 mg via INTRAVENOUS

## 2016-10-18 MED ORDER — IOPAMIDOL (ISOVUE-M 300) INJECTION 61%
INTRAMUSCULAR | Status: DC | PRN
  Administered 2016-10-18: 30 mL via INTRATHECAL

## 2016-10-18 MED ORDER — LACTATED RINGERS IV SOLN
INTRAVENOUS | Status: DC
Start: 2016-10-18 — End: 2016-10-19
  Administered 2016-10-18 (×2): via INTRAVENOUS

## 2016-10-18 MED ORDER — PROPOFOL 10 MG/ML IV BOLUS
INTRAVENOUS | Status: DC | PRN
  Administered 2016-10-18: 150 mg via INTRAVENOUS

## 2016-10-18 MED ORDER — GLUCAGON HCL RDNA (DIAGNOSTIC) 1 MG IJ SOLR
INTRAMUSCULAR | Status: AC
  Filled 2016-10-18: qty 1

## 2016-10-18 MED ORDER — FENTANYL CITRATE (PF) 100 MCG/2ML IJ SOLN
INTRAMUSCULAR | Status: DC | PRN
  Administered 2016-10-18 (×2): 100 ug via INTRAVENOUS

## 2016-10-18 MED ORDER — IOPAMIDOL (ISOVUE-300) INJECTION 61%
INTRAVENOUS | Status: AC
  Filled 2016-10-18: qty 50

## 2016-10-18 NOTE — Interval H&P Note (Signed)
History and Physical Interval Note:  10/18/2016 11:54 AM  Carrie Gray  has presented today for surgery, with the diagnosis of choledocolithiasis  The various methods of treatment have been discussed with the patient and family. After consideration of risks, benefits and other options for treatment, the patient has consented to  Procedure(s): ENDOSCOPIC RETROGRADE CHOLANGIOPANCREATOGRAPHY (ERCP) WITH PROPOFOL (N/A) as a surgical intervention .  The patient's history has been reviewed, patient examined, no change in status, stable for surgery.  I have reviewed the patient's chart and labs.  Questions were answered to the patient's satisfaction.     Keandrea Tapley M  Assessment:  1.  CBD stone(s) on intraoperative cholangiogram. 2.  Elevated LFTs.  Plan:  1.  Endoscopic retrograde cholangiopancreatography with biliary sphincterotomy and stone extraction. 2.  Risks (up to and including bleeding, infection, perforation, pancreatitis that can be complicated by infected necrosis and death), benefits (removal of stones, alleviating blockage, decreasing risk of cholangitis or choledocholithiasis-related pancreatitis), and alternatives (watchful waiting, percutaneous transhepatic cholangiography) of ERCP were explained to patient/family in detail and patient elects to proceed.

## 2016-10-18 NOTE — Anesthesia Procedure Notes (Signed)
Procedure Name: Intubation Date/Time: 10/18/2016 12:12 PM Performed by: Greggory Stallion, Anne-Marie Genson L Pre-anesthesia Checklist: Patient identified, Emergency Drugs available, Suction available and Patient being monitored Patient Re-evaluated:Patient Re-evaluated prior to induction Oxygen Delivery Method: Circle System Utilized Preoxygenation: Pre-oxygenation with 100% oxygen Induction Type: IV induction Ventilation: Mask ventilation without difficulty Laryngoscope Size: Mac and 3 Grade View: Grade II Tube type: Oral Tube size: 7.0 mm Number of attempts: 1 Airway Equipment and Method: Stylet and Oral airway Placement Confirmation: ETT inserted through vocal cords under direct vision,  positive ETCO2 and breath sounds checked- equal and bilateral Secured at: 21 cm Tube secured with: Tape Dental Injury: Teeth and Oropharynx as per pre-operative assessment

## 2016-10-18 NOTE — Op Note (Signed)
[facility] Patient Name: Carrie Gray Procedure Date : 10/18/2016 MRN: [id number] Attending MD: Willis Modena , MD Date of Birth: [birth year] CSN: [id number] Age: 51 Admit Type: Outpatient Procedure:                ERCP Indications:              Abnormal intraoperative cholangiogram, Filling                            defect on intraoperative cholangiogram Providers:                Willis Modena, MD, Tillie Fantasia, RN, Arlee Muslim Tech., Technician, Ferol Luz, CRNA,                            Kathi Der, MD Referring MD:              Medicines:                Sedation Administered by an Anesthesia                            Professional, Ciprofloxacine, Indomethacine Complications:            No immediate complications. Estimated Blood Loss:     Estimated blood loss was minimal. Procedure:                Pre-Anesthesia Assessment:                           - Prior to the procedure, a History and Physical                            was performed, and patient medications and                            allergies were reviewed. The patient's tolerance of                            previous anesthesia was also reviewed. The risks                            and benefits of the procedure and the sedation                            options and risks were discussed with the patient.                            All questions were answered, and informed consent                            was obtained. Prior Anticoagulants: The patient has                            taken no previous anticoagulant or  antiplatelet                            agents. ASA Grade Assessment: I - A normal, healthy                            patient. After reviewing the risks and benefits,                            the patient was deemed in satisfactory condition to                            undergo the procedure.                           After obtaining  informed consent, the scope was                            passed under direct vision. Throughout the                            procedure, the patient's blood pressure, pulse, and                            oxygen saturations were monitored continuously. The                            [id number]) scope was introduced through                            the mouth, and used to inject contrast into and                            used to cannulate the bile duct. The ERCP was                            accomplished without difficulty. The patient                            tolerated the procedure well. Scope In: Scope Out: Findings:      The scout film was normal. The major papilla was normal. The bile duct       was deeply cannulated. Contrast was injected. I personally interpreted       the bile duct images. Ductal flow of contrast was adequate. The main       bile duct contained no obvious filling defects. The upper third of the       main bile duct was mildly dilated. The largest diameter was 8 mm.       Biliary sphincterotomy was made with a braided traction (standard)       sphincterotome using ERBE electrocautery. There was no       post-sphincterotomy bleeding. To discover objects, the biliary tree was       swept with a basket starting at the upper third of the main bile duct. Marland Kitchen  To discover objects, the biliary tree was swept with a 9 mm balloon       starting at the upper third of the main bile duct. Post-occlusion       cholangiogram showed no filling defects. There was good flow of contrast       through the ampulla pre- and post-procedure. There was no contrast       injection in the PD. Impression:               - The major papilla appeared normal.                           - The upper third of the main bile duct was mildly                            dilated.                           - A biliary sphincterotomy was performed.                           - The  biliary tree was swept and nothing was found                            using Basket .                           - The biliary tree was swept and nothing was found                            using Balloon .                           - No bile duct stones seen. Images from yesterday                            could have been air bubbles or, conversely, those                            stones could have spontaneously passed. Recommendation:           - Return patient to hospital ward for ongoing care.                           - Clear liquid diet.                           - Observe patient's clinical course.                           - Check liver enzymes (AST, ALT, alkaline                            phosphatase, bilirubin) in the morning.                           Deboraha Sprang  GI will revisit tomorrow. If doing well,                            consider discharge home tomorrow, and could then                            follow-up with [facility] on as-needed basis. Procedure Code(s):        --- Professional ---                           [phone], Endoscopic retrograde                            cholangiopancreatography (ERCP); with                            sphincterotomy/papillotomy Diagnosis Code(s):        --- Professional ---                           R93.2, Abnormal findings on diagnostic imaging of                            liver and biliary tract                           K83.8, Other specified diseases of biliary tract CPT copyright 2016 American Medical Association. All rights reserved. The codes documented in this report are preliminary and upon coder review may  be revised to meet current compliance requirements. Willis Modena, MD 10/18/2016 1:03:28 PM This report has been signed electronically. Kathi Der, MD Number of Addenda: 0

## 2016-10-18 NOTE — Progress Notes (Signed)
Patient returned to floor via transport.

## 2016-10-18 NOTE — Anesthesia Preprocedure Evaluation (Signed)
Anesthesia Evaluation  Patient identified by MRN, date of birth, ID band Patient awake    Reviewed: Allergy & Precautions, NPO status , Patient's Chart, lab work & pertinent test results  Airway Mallampati: II  TM Distance: >3 FB Neck ROM: Full    Dental no notable dental hx.    Pulmonary neg pulmonary ROS, former smoker,    Pulmonary exam normal breath sounds clear to auscultation       Cardiovascular negative cardio ROS Normal cardiovascular exam Rhythm:Regular Rate:Normal     Neuro/Psych negative neurological ROS  negative psych ROS   GI/Hepatic negative GI ROS, Neg liver ROS,   Endo/Other  negative endocrine ROS  Renal/GU negative Renal ROS  negative genitourinary   Musculoskeletal negative musculoskeletal ROS (+)   Abdominal   Peds negative pediatric ROS (+)  Hematology negative hematology ROS (+)   Anesthesia Other Findings   Reproductive/Obstetrics negative OB ROS                             Anesthesia Physical Anesthesia Plan  ASA: II  Anesthesia Plan: General   Post-op Pain Management:    Induction: Intravenous  PONV Risk Score and Plan: 3 and Ondansetron, Dexamethasone, Midazolam and Treatment may vary due to age or medical condition  Airway Management Planned: Oral ETT  Additional Equipment:   Intra-op Plan:   Post-operative Plan: Extubation in OR  Informed Consent: I have reviewed the patients History and Physical, chart, labs and discussed the procedure including the risks, benefits and alternatives for the proposed anesthesia with the patient or authorized representative who has indicated his/her understanding and acceptance.     Dental advisory given  Plan Discussed with: CRNA  Anesthesia Plan Comments:         Anesthesia Quick Evaluation  

## 2016-10-18 NOTE — Progress Notes (Signed)
Patient ID: Carrie Gray, female   DOB: 06/28/1965, 51 y.o.   MRN: [id number] [facility] Surgery Progress Note:   Day of Surgery  Subjective: Mental status is clear.  Doing well after ERCP Objective: Vital signs in last 24 hours: Temp:  [97.7 F (36.5 C)-99.3 F (37.4 C)] 98.8 F (37.1 C) (10/05 1530) Pulse Rate:  [59-94] 71 (10/05 1530) Resp:  [11-18] 11 (10/05 1342) BP: (99-120)/(60-78) 99/69 (10/05 1530) SpO2:  [96 %-100 %] 100 % (10/05 1530)  Intake/Output from previous day: 10/04 0701 - 10/05 0700 In: 3176.7 [P.O.:420; I.V.:2706.7; IV Piggyback:50] Out: 2920 [Urine:2900; Blood:20] Intake/Output this shift: Total I/O In: 1655 [P.O.:300; I.V.:1355] Out: 825 [Urine:825]  Physical Exam: Work of breathing is not labored.  Minimal abdominal pain  Lab Results:  Results for orders placed or performed during the hospital encounter of 10/17/16 (from the past 48 hour(s))  hCG, serum, qualitative     Status: None   Collection Time: 10/17/16  9:10 AM  Result Value Ref Range   Preg, Serum NEGATIVE NEGATIVE    Comment:        THE SENSITIVITY OF THIS METHODOLOGY IS >10 mIU/mL.   CBC     Status: Abnormal   Collection Time: 10/17/16  2:52 PM  Result Value Ref Range   WBC 12.7 (H) 4.0 - 10.5 K/uL   RBC 4.43 3.87 - 5.11 MIL/uL   Hemoglobin 13.5 12.0 - 15.0 g/dL   HCT 39.4 36.0 - 46.0 %   MCV 88.9 78.0 - 100.0 fL   MCH 30.5 26.0 - 34.0 pg   MCHC 34.3 30.0 - 36.0 g/dL   RDW 12.6 11.5 - 15.5 %   Platelets 202 150 - 400 K/uL  Creatinine, serum     Status: None   Collection Time: 10/17/16  2:52 PM  Result Value Ref Range   Creatinine, Ser 0.92 0.44 - 1.00 mg/dL   GFR calc non Af Amer >60 >60 mL/min   GFR calc Af Amer >60 >60 mL/min    Comment: (NOTE) The eGFR has been calculated using the CKD EPI equation. This calculation has not been validated in all clinical situations. eGFR's persistently <60 mL/min signify possible Chronic Kidney Disease.   Comprehensive metabolic  panel     Status: Abnormal   Collection Time: 10/18/16  4:32 AM  Result Value Ref Range   Sodium 139 135 - 145 mmol/L   Potassium 4.5 3.5 - 5.1 mmol/L   Chloride 108 101 - 111 mmol/L   CO2 24 22 - 32 mmol/L   Glucose, Bld 149 (H) 65 - 99 mg/dL   BUN 8 6 - 20 mg/dL   Creatinine, Ser 0.91 0.44 - 1.00 mg/dL   Calcium 8.2 (L) 8.9 - 10.3 mg/dL   Total Protein 6.4 (L) 6.5 - 8.1 g/dL   Albumin 3.5 3.5 - 5.0 g/dL   AST 88 (H) 15 - 41 U/L   ALT 111 (H) 14 - 54 U/L   Alkaline Phosphatase 57 38 - 126 U/L   Total Bilirubin 0.3 0.3 - 1.2 mg/dL   GFR calc non Af Amer >60 >60 mL/min   GFR calc Af Amer >60 >60 mL/min    Comment: (NOTE) The eGFR has been calculated using the CKD EPI equation. This calculation has not been validated in all clinical situations. eGFR's persistently <60 mL/min signify possible Chronic Kidney Disease.    Anion gap 7 5 - 15  CBC with Differential/Platelet     Status: Abnormal  Collection Time: 10/18/16  4:32 AM  Result Value Ref Range   WBC 12.9 (H) 4.0 - 10.5 K/uL   RBC 4.14 3.87 - 5.11 MIL/uL   Hemoglobin 12.6 12.0 - 15.0 g/dL   HCT 36.9 36.0 - 46.0 %   MCV 89.1 78.0 - 100.0 fL   MCH 30.4 26.0 - 34.0 pg   MCHC 34.1 30.0 - 36.0 g/dL   RDW 12.6 11.5 - 15.5 %   Platelets 209 150 - 400 K/uL   Neutrophils Relative % 85 %   Neutro Abs 11.0 (H) 1.7 - 7.7 K/uL   Lymphocytes Relative 9 %   Lymphs Abs 1.1 0.7 - 4.0 K/uL   Monocytes Relative 6 %   Monocytes Absolute 0.7 0.1 - 1.0 K/uL   Eosinophils Relative 0 %   Eosinophils Absolute 0.0 0.0 - 0.7 K/uL   Basophils Relative 0 %   Basophils Absolute 0.0 0.0 - 0.1 K/uL    Radiology/Results: Dg Cholangiogram Operative  Result Date: 10/17/2016 CLINICAL DATA:  Cholelithiasis EXAM: INTRAOPERATIVE CHOLANGIOGRAM TECHNIQUE: Cholangiographic images from the C-arm fluoroscopic device were submitted for interpretation post-operatively. Please see the procedural report for the amount of contrast and the fluoroscopy time  utilized. COMPARISON:  09/10/2016 FINDINGS: Intraoperative cholangiogram performed during the laparoscopic procedure. Several small round filling defects in the common bile duct distally with diffuse mild biliary dilatation. Eventually, there is flow demonstrated into the duodenum IMPRESSION: Positive exam for choledocholithiasis and associated biliary dilatation. Electronically Signed   By: Jerilynn Mages.  Shick M.D.   On: 10/17/2016 13:15   Dg Ercp Biliary & Pancreatic Ducts  Result Date: 10/18/2016 CLINICAL DATA:  Gallstone EXAM: ERCP TECHNIQUE: Multiple spot images obtained with the fluoroscopic device and submitted for interpretation post-procedure. FLUOROSCOPY TIME:  Fluoroscopy Time:  3 minutes and 13 seconds Radiation Exposure Index (if provided by the fluoroscopic device): Number of Acquired Spot Images: 2 COMPARISON:  None. FINDINGS: On the initial image, the common bile duct and biliary tree fills with contrast. Balloon stone retrieval is documented on the second image. IMPRESSION: See above. These images were submitted for radiologic interpretation only. Please see the procedural report for the amount of contrast and the fluoroscopy time utilized. Electronically Signed   By: Marybelle Killings M.D.   On: 10/18/2016 13:33    Anti-infectives: Anti-infectives    Start     Dose/Rate Route Frequency Ordered Stop   10/18/16 1030  ciprofloxacin (CIPRO) IVPB 400 mg     400 mg 200 mL/hr over 60 Minutes Intravenous 60 min pre-op 10/17/16 1428 10/18/16 1120   10/17/16 2200  cefoTEtan (CEFOTAN) 2 g in dextrose 5 % 50 mL IVPB     2 g 100 mL/hr over 30 Minutes Intravenous Every 12 hours 10/17/16 1445     10/17/16 0850  ceFAZolin (ANCEF) IVPB 2g/100 mL premix     2 g 200 mL/hr over 30 Minutes Intravenous On call to O.R. 10/17/16 0850 10/17/16 1101      Assessment/Plan: Problem List: Patient Active Problem List   Diagnosis Date Noted  . Chronic cholecystitis 10/17/2016  . Abdominal pain 09/09/2016  . Dizziness  and giddiness 08/17/2014  . Grief 08/17/2014  . Routine general medical examination at a health care facility 07/31/2012  . Encounter for routine gynecological examination 07/31/2012  . Allergic rhinitis 06/03/2012    Appreciate Dr. Erlinda Hong assistance.  Doing well.  Hopeful discharge in AM Day of Surgery    LOS: 0 days   Matt B. Hassell Done, MD, [facility]  [facility], [place]. [phone] beeper [phone]  10/18/2016 4:53 PM

## 2016-10-19 ENCOUNTER — Encounter (HOSPITAL_COMMUNITY): Payer: Self-pay | Admitting: Gastroenterology

## 2016-10-19 DIAGNOSIS — K8010 Calculus of gallbladder with chronic cholecystitis without obstruction: Secondary | ICD-10-CM | POA: Diagnosis not present

## 2016-10-19 LAB — COMPREHENSIVE METABOLIC PANEL
ALK PHOS: 50 U/L (ref 38–126)
ALT: 112 U/L — ABNORMAL HIGH (ref 14–54)
ANION GAP: 7 (ref 5–15)
AST: 73 U/L — ABNORMAL HIGH (ref 15–41)
Albumin: 3.1 g/dL — ABNORMAL LOW (ref 3.5–5.0)
BILIRUBIN TOTAL: 0.3 mg/dL (ref 0.3–1.2)
BUN: 9 mg/dL (ref 6–20)
CALCIUM: 7.8 mg/dL — AB (ref 8.9–10.3)
CO2: 23 mmol/L (ref 22–32)
Chloride: 109 mmol/L (ref 101–111)
Creatinine, Ser: 0.9 mg/dL (ref 0.44–1.00)
Glucose, Bld: 108 mg/dL — ABNORMAL HIGH (ref 65–99)
POTASSIUM: 4.2 mmol/L (ref 3.5–5.1)
Sodium: 139 mmol/L (ref 135–145)
TOTAL PROTEIN: 5.4 g/dL — AB (ref 6.5–8.1)

## 2016-10-19 MED ORDER — HYDROCODONE-ACETAMINOPHEN 5-325 MG PO TABS: 1.0000 | ORAL_TABLET | ORAL | 0 refills | Status: DC | PRN

## 2016-10-19 MED ORDER — PANTOPRAZOLE SODIUM 40 MG PO TBEC: 40.0000 mg | DELAYED_RELEASE_TABLET | Freq: Every day | ORAL | Status: DC

## 2016-10-19 NOTE — Progress Notes (Signed)
Patient ID: Carrie Gray, female   DOB: October 06, 1965, 51 y.o.   MRN: [id number]  Subjective: No complaints  Objective: Vital signs in last 24 hours: Temp:  [97.5 F (36.4 C)-99.3 F (37.4 C)] 97.9 F (36.6 C) (10/06 0500) Pulse Rate:  [59-94] 67 (10/06 0500) Resp:  [11-18] 18 (10/06 0500) BP: (90-120)/(58-75) 90/58 (10/06 0500) SpO2:  [98 %-100 %] 98 % (10/06 0500)  Intake/Output from previous day: 10/05 0701 - 10/06 0700 In: 4215.3 [P.O.:1420; I.V.:2745.3; IV Piggyback:50] Out: 7322 [Urine:1576] Intake/Output this shift: No intake/output data recorded.  Physical Exam: A&o x 3. NAD Resp- breathign comfortably abd s,NT, ND Ext warm  Lab Results:  Results for orders placed or performed during the hospital encounter of 10/17/16 (from the past 48 hour(s))  CBC     Status: Abnormal   Collection Time: 10/17/16  2:52 PM  Result Value Ref Range   WBC 12.7 (H) 4.0 - 10.5 K/uL   RBC 4.43 3.87 - 5.11 MIL/uL   Hemoglobin 13.5 12.0 - 15.0 g/dL   HCT 39.4 36.0 - 46.0 %   MCV 88.9 78.0 - 100.0 fL   MCH 30.5 26.0 - 34.0 pg   MCHC 34.3 30.0 - 36.0 g/dL   RDW 12.6 11.5 - 15.5 %   Platelets 202 150 - 400 K/uL  Creatinine, serum     Status: None   Collection Time: 10/17/16  2:52 PM  Result Value Ref Range   Creatinine, Ser 0.92 0.44 - 1.00 mg/dL   GFR calc non Af Amer >60 >60 mL/min   GFR calc Af Amer >60 >60 mL/min    Comment: (NOTE) The eGFR has been calculated using the CKD EPI equation. This calculation has not been validated in all clinical situations. eGFR's persistently <60 mL/min signify possible Chronic Kidney Disease.   Comprehensive metabolic panel     Status: Abnormal   Collection Time: 10/18/16  4:32 AM  Result Value Ref Range   Sodium 139 135 - 145 mmol/L   Potassium 4.5 3.5 - 5.1 mmol/L   Chloride 108 101 - 111 mmol/L   CO2 24 22 - 32 mmol/L   Glucose, Bld 149 (H) 65 - 99 mg/dL   BUN 8 6 - 20 mg/dL   Creatinine, Ser 0.91 0.44 - 1.00 mg/dL   Calcium 8.2 (L)  8.9 - 10.3 mg/dL   Total Protein 6.4 (L) 6.5 - 8.1 g/dL   Albumin 3.5 3.5 - 5.0 g/dL   AST 88 (H) 15 - 41 U/L   ALT 111 (H) 14 - 54 U/L   Alkaline Phosphatase 57 38 - 126 U/L   Total Bilirubin 0.3 0.3 - 1.2 mg/dL   GFR calc non Af Amer >60 >60 mL/min   GFR calc Af Amer >60 >60 mL/min    Comment: (NOTE) The eGFR has been calculated using the CKD EPI equation. This calculation has not been validated in all clinical situations. eGFR's persistently <60 mL/min signify possible Chronic Kidney Disease.    Anion gap 7 5 - 15  CBC with Differential/Platelet     Status: Abnormal   Collection Time: 10/18/16  4:32 AM  Result Value Ref Range   WBC 12.9 (H) 4.0 - 10.5 K/uL   RBC 4.14 3.87 - 5.11 MIL/uL   Hemoglobin 12.6 12.0 - 15.0 g/dL   HCT 36.9 36.0 - 46.0 %   MCV 89.1 78.0 - 100.0 fL   MCH 30.4 26.0 - 34.0 pg   MCHC 34.1 30.0 - 36.0 g/dL  RDW 12.6 11.5 - 15.5 %   Platelets 209 150 - 400 K/uL   Neutrophils Relative % 85 %   Neutro Abs 11.0 (H) 1.7 - 7.7 K/uL   Lymphocytes Relative 9 %   Lymphs Abs 1.1 0.7 - 4.0 K/uL   Monocytes Relative 6 %   Monocytes Absolute 0.7 0.1 - 1.0 K/uL   Eosinophils Relative 0 %   Eosinophils Absolute 0.0 0.0 - 0.7 K/uL   Basophils Relative 0 %   Basophils Absolute 0.0 0.0 - 0.1 K/uL  Comprehensive metabolic panel     Status: Abnormal   Collection Time: 10/19/16  3:59 AM  Result Value Ref Range   Sodium 139 135 - 145 mmol/L   Potassium 4.2 3.5 - 5.1 mmol/L   Chloride 109 101 - 111 mmol/L   CO2 23 22 - 32 mmol/L   Glucose, Bld 108 (H) 65 - 99 mg/dL   BUN 9 6 - 20 mg/dL   Creatinine, Ser 0.90 0.44 - 1.00 mg/dL   Calcium 7.8 (L) 8.9 - 10.3 mg/dL   Total Protein 5.4 (L) 6.5 - 8.1 g/dL   Albumin 3.1 (L) 3.5 - 5.0 g/dL   AST 73 (H) 15 - 41 U/L   ALT 112 (H) 14 - 54 U/L   Alkaline Phosphatase 50 38 - 126 U/L   Total Bilirubin 0.3 0.3 - 1.2 mg/dL   GFR calc non Af Amer >60 >60 mL/min   GFR calc Af Amer >60 >60 mL/min    Comment: (NOTE) The eGFR has  been calculated using the CKD EPI equation. This calculation has not been validated in all clinical situations. eGFR's persistently <60 mL/min signify possible Chronic Kidney Disease.    Anion gap 7 5 - 15    Radiology/Results: Dg Cholangiogram Operative  Result Date: 10/17/2016 CLINICAL DATA:  Cholelithiasis EXAM: INTRAOPERATIVE CHOLANGIOGRAM TECHNIQUE: Cholangiographic images from the C-arm fluoroscopic device were submitted for interpretation post-operatively. Please see the procedural report for the amount of contrast and the fluoroscopy time utilized. COMPARISON:  09/10/2016 FINDINGS: Intraoperative cholangiogram performed during the laparoscopic procedure. Several small round filling defects in the common bile duct distally with diffuse mild biliary dilatation. Eventually, there is flow demonstrated into the duodenum IMPRESSION: Positive exam for choledocholithiasis and associated biliary dilatation. Electronically Signed   By: Jerilynn Mages.  Shick M.D.   On: 10/17/2016 13:15   Dg Ercp Biliary & Pancreatic Ducts  Result Date: 10/18/2016 CLINICAL DATA:  Gallstone EXAM: ERCP TECHNIQUE: Multiple spot images obtained with the fluoroscopic device and submitted for interpretation post-procedure. FLUOROSCOPY TIME:  Fluoroscopy Time:  3 minutes and 13 seconds Radiation Exposure Index (if provided by the fluoroscopic device): Number of Acquired Spot Images: 2 COMPARISON:  None. FINDINGS: On the initial image, the common bile duct and biliary tree fills with contrast. Balloon stone retrieval is documented on the second image. IMPRESSION: See above. These images were submitted for radiologic interpretation only. Please see the procedural report for the amount of contrast and the fluoroscopy time utilized. Electronically Signed   By: Marybelle Killings M.D.   On: 10/18/2016 13:33    Anti-infectives: Anti-infectives    Start     Dose/Rate Route Frequency Ordered Stop   10/18/16 1030  ciprofloxacin (CIPRO) IVPB 400 mg      400 mg 200 mL/hr over 60 Minutes Intravenous 60 min pre-op 10/17/16 1428 10/18/16 1120   10/17/16 2200  cefoTEtan (CEFOTAN) 2 g in dextrose 5 % 50 mL IVPB     2 g 100  mL/hr over 30 Minutes Intravenous Every 12 hours 10/17/16 1445     10/17/16 0850  ceFAZolin (ANCEF) IVPB 2g/100 mL premix     2 g 200 mL/hr over 30 Minutes Intravenous On call to O.R. 10/17/16 0850 10/17/16 1101      Assessment/Plan: Problem List: Patient Active Problem List   Diagnosis Date Noted  . Chronic cholecystitis 10/17/2016  . Abdominal pain 09/09/2016  . Dizziness and giddiness 08/17/2014  . Grief 08/17/2014  . Routine general medical examination at a health care facility 07/31/2012  . Encounter for routine gynecological examination 07/31/2012  . Allergic rhinitis 06/03/2012   Doing well D/C home   LOS: 0 days   10/19/2016 10:10 AM

## 2016-10-19 NOTE — Progress Notes (Signed)
The patient is receiving Protonix by the intravenous route.  Based on criteria approved by the Pharmacy and Therapeutics Committee and the Medical Executive Committee, the medication is being converted to the equivalent oral dose form.  These criteria include: -No active GI bleeding -Able to tolerate diet of full liquids (or better) or tube feeding -Able to tolerate other medications by the oral or enteral route  If you have any questions about this conversion, please contact the Pharmacy Department (phone [phone]).  Thank you. Herby Abraham, Pharm.D. [phone] 10/19/2016 7:50 AM

## 2016-10-19 NOTE — Discharge Instructions (Signed)
[facility] ______[facility] °LAPAROSCOPIC SURGERY: POST OP INSTRUCTIONS °Always review your discharge instruction sheet given to you by the facility where your surgery was performed. °IF YOU HAVE DISABILITY OR FAMILY LEAVE FORMS, YOU MUST BRING THEM TO THE OFFICE FOR PROCESSING.   °DO NOT GIVE THEM TO YOUR DOCTOR. ° °1. A prescription for pain medication may be given to you upon discharge.  Take your pain medication as prescribed, if needed.  If narcotic pain medicine is not needed, then you may take acetaminophen (Tylenol) or ibuprofen (Advil) as needed. °2. Take your usually prescribed medications unless otherwise directed. °3. If you need a refill on your pain medication, please contact your pharmacy.  They will contact our office to request authorization. Prescriptions will not be filled after 5pm or on week-ends. °4. You should follow a light diet the first few days after arrival home, such as soup and crackers, etc.  Be sure to include lots of fluids daily. °5. Most patients will experience some swelling and bruising in the area of the incisions.  Ice packs will help.  Swelling and bruising can take several days to resolve.  °6. It is common to experience some constipation if taking pain medication after surgery.  Increasing fluid intake and taking a stool softener (such as Colace) will usually help or prevent this problem from occurring.  A mild laxative (Milk of Magnesia or Miralax) should be taken according to package instructions if there are no bowel movements after 48 hours. °7. Unless discharge instructions indicate otherwise, you may remove your bandages 24-48 hours after surgery, and you may shower at that time.  You may have steri-strips (small skin tapes) in place directly over the incision.  These strips should be left on the skin for 7-10 days.  If your surgeon used skin glue on the incision, you may shower in 24 hours.  The glue will flake off over the next 2-3 weeks.  Any sutures or  staples will be removed at the office during your follow-up visit. °8. ACTIVITIES:  You may resume regular (light) daily activities beginning the next day--such as daily self-care, walking, climbing stairs--gradually increasing activities as tolerated.  You may have sexual intercourse when it is comfortable.  Refrain from any heavy lifting or straining until approved by your doctor. °a. You may drive when you are no longer taking prescription pain medication, you can comfortably wear a seatbelt, and you can safely maneuver your car and apply brakes. °b. RETURN TO WORK:  __________________________________________________________ °9. You should see your doctor in the office for a follow-up appointment approximately 2-3 weeks after your surgery.  Make sure that you call for this appointment within a day or two after you arrive home to insure a convenient appointment time. °10. OTHER INSTRUCTIONS: __________________________________________________________________________________________________________________________ __________________________________________________________________________________________________________________________ °WHEN TO CALL YOUR DOCTOR: °1. Fever over 101.0 °2. Inability to urinate °3. Continued bleeding from incision. °4. Increased pain, redness, or drainage from the incision. °5. Increasing abdominal pain ° °The clinic staff is available to answer your questions during regular business hours.  Please don’t hesitate to call and ask to speak to one of the nurses for clinical concerns.  If you have a medical emergency, go to the nearest emergency room or call 911.  A surgeon from [facility] is always on call at the hospital. °[address], [address], [place], [place]  [address] ? [address] [address], [place], [place]   [address] °[phone] ? [phone] ? FAX [phone] °Web site:   [email or web address] °

## 2016-10-21 ENCOUNTER — Encounter (HOSPITAL_COMMUNITY): Payer: Self-pay | Admitting: Gastroenterology

## 2016-10-21 NOTE — Transfer of Care (Signed)
Immediate Anesthesia Transfer of Care Note  Patient: Carrie Gray  Procedure(s) Performed: ENDOSCOPIC RETROGRADE CHOLANGIOPANCREATOGRAPHY (ERCP) WITH PROPOFOL (N/A )  Patient Location: PACU and Endoscopy Unit  Anesthesia Type:General  Level of Consciousness: awake, alert , oriented and patient cooperative  Airway & Oxygen Therapy: Patient Spontanous Breathing and Patient connected to nasal cannula oxygen  Post-op Assessment: Report given to RN, Post -op Vital signs reviewed and stable and Patient moving all extremities  Post vital signs: Reviewed and stable  Last Vitals:  Vitals:   10/19/16 0500 10/19/16 1005  BP: (!) 90/58 98/63  Pulse: 67 74  Resp: 18 16  Temp: 36.6 C 36.8 C  SpO2: 98% 100%    Last Pain:  Vitals:   10/19/16 1005  TempSrc: Oral  PainSc:       Patients Stated Pain Goal: 2 (10/17/16 2018)  Complications: No apparent anesthesia complications

## 2016-10-22 NOTE — Anesthesia Postprocedure Evaluation (Signed)
Anesthesia Post Note  Patient: Carrie Gray  Procedure(s) Performed: ENDOSCOPIC RETROGRADE CHOLANGIOPANCREATOGRAPHY (ERCP) WITH PROPOFOL (N/A )     Patient location during evaluation: Endoscopy Anesthesia Type: General Level of consciousness: awake and alert Pain management: pain level controlled Vital Signs Assessment: post-procedure vital signs reviewed and stable Respiratory status: spontaneous breathing, nonlabored ventilation, respiratory function stable and patient connected to nasal cannula oxygen Cardiovascular status: blood pressure returned to baseline and stable Postop Assessment: no apparent nausea or vomiting Anesthetic complications: no    Last Vitals:  Vitals:   10/19/16 0500 10/19/16 1005  BP: (!) 90/58 98/63  Pulse: 67 74  Resp: 18 16  Temp: 36.6 C 36.8 C  SpO2: 98% 100%    Last Pain:  Vitals:   10/19/16 1005  TempSrc: Oral  PainSc:                  Phillips Grout

## 2016-11-01 NOTE — Discharge Summary (Signed)
Physician Discharge Summary  Patient ID: Carrie BarefootSally Z Dooner MRN: [id number] DOB/AGE: Jun 11, 1965 51 y.o.  Admit date: 10/17/2016 Discharge date: 10/19/2016  Admission Diagnoses:  Chronic cholecystitis Discharge Diagnoses:  same  Active Problems:   Chronic cholecystitis   Surgery:  Laparoscopic cholecystectomy with IOC  Discharged Condition: improved  Hospital Course:   Had lap chole with IOC.  ? CBC stones for which Dr. Dulce Sellarutlaw was consulted and performed ERCP removing sludge.  The patient was discharged by Dr. Donell BeersByerly.    Consults: Will Outlaw  Significant Diagnostic Studies: IOC and ERCP    Discharge Exam: Blood pressure 98/63, pulse 74, temperature 98.3 F (36.8 C), temperature source Oral, resp. rate 16, height 5\' 4"  (1.626 m), weight 78 kg (172 lb), last menstrual period 10/14/2016, SpO2 100 %. Was done by Dr. Donell BeersByerly.  Presumed to be OK  Disposition: 01-Home or Self Care  Discharge Instructions    Call MD for:  difficulty breathing, headache or visual disturbances    Complete by:  As directed    Call MD for:  persistant dizziness or light-headedness    Complete by:  As directed    Call MD for:  persistant nausea and vomiting    Complete by:  As directed    Call MD for:  redness, tenderness, or signs of infection (pain, swelling, redness, odor or green/yellow discharge around incision site)    Complete by:  As directed    Call MD for:  severe uncontrolled pain    Complete by:  As directed    Call MD for:  temperature >100.4    Complete by:  As directed    Diet - low sodium heart healthy    Complete by:  As directed    Increase activity slowly    Complete by:  As directed    No wound care    Complete by:  As directed      Allergies as of 10/19/2016   No Known Allergies     Medication List    TAKE these medications   HYDROcodone-acetaminophen 5-325 MG tablet Commonly known as:  NORCO/VICODIN Take 1-2 tablets by mouth every 4 (four) hours as needed for moderate  pain.   ibuprofen 200 MG tablet Commonly known as:  ADVIL,MOTRIN Take 400-800 mg by mouth every 6 (six) hours as needed for headache or mild pain (2-4 tablets depending on pain).   latanoprost 0.005 % ophthalmic solution Commonly known as:  XALATAN Place 1 drop into both eyes at bedtime.      Follow-up Information    Luretha MurphyMartin, Daven Montz, MD Follow up in 3 week(s).   Specialty:  General Surgery Contact information: [address] [place] [address] [phone]           Signed: Valarie MerinoMARTIN,Kendarious Gudino B 11/01/2016, 7:09 AM

## 2017-05-14 ENCOUNTER — Encounter: Payer: Self-pay | Admitting: Family Medicine

## 2017-09-02 ENCOUNTER — Ambulatory Visit (INDEPENDENT_AMBULATORY_CARE_PROVIDER_SITE_OTHER): Payer: Commercial Managed Care - PPO | Admitting: Family Medicine

## 2017-09-02 ENCOUNTER — Other Ambulatory Visit (HOSPITAL_COMMUNITY)
Admission: RE | Admit: 2017-09-02 | Discharge: 2017-09-02 | Disposition: A | Discharge status: Home / Self Care | Payer: Commercial Managed Care - PPO | Source: Ambulatory Visit | Attending: Family Medicine | Admitting: Family Medicine

## 2017-09-02 ENCOUNTER — Encounter: Payer: Self-pay | Admitting: Family Medicine

## 2017-09-02 VITALS — BP 116/74 | HR 80 | Temp 98.4°F | Ht 64.0 in | Wt 175.4 lb

## 2017-09-02 DIAGNOSIS — Z1239 Encounter for other screening for malignant neoplasm of breast: Secondary | ICD-10-CM

## 2017-09-02 DIAGNOSIS — Z0000 Encounter for general adult medical examination without abnormal findings: Secondary | ICD-10-CM | POA: Insufficient documentation

## 2017-09-02 DIAGNOSIS — N898 Other specified noninflammatory disorders of vagina: Secondary | ICD-10-CM

## 2017-09-02 DIAGNOSIS — Z6830 Body mass index (BMI) 30.0-30.9, adult: Secondary | ICD-10-CM

## 2017-09-02 DIAGNOSIS — Z1211 Encounter for screening for malignant neoplasm of colon: Secondary | ICD-10-CM | POA: Diagnosis not present

## 2017-09-02 DIAGNOSIS — Z23 Encounter for immunization: Secondary | ICD-10-CM | POA: Diagnosis not present

## 2017-09-02 DIAGNOSIS — K811 Chronic cholecystitis: Secondary | ICD-10-CM

## 2017-09-02 DIAGNOSIS — Z1231 Encounter for screening mammogram for malignant neoplasm of breast: Secondary | ICD-10-CM

## 2017-09-02 DIAGNOSIS — Z6827 Body mass index (BMI) 27.0-27.9, adult: Secondary | ICD-10-CM | POA: Insufficient documentation

## 2017-09-02 DIAGNOSIS — Z01419 Encounter for gynecological examination (general) (routine) without abnormal findings: Secondary | ICD-10-CM

## 2017-09-02 LAB — COMPREHENSIVE METABOLIC PANEL
ALBUMIN: 4.5 g/dL (ref 3.5–5.2)
ALK PHOS: 53 U/L (ref 39–117)
ALT: 25 U/L (ref 0–35)
AST: 17 U/L (ref 0–37)
BILIRUBIN TOTAL: 0.4 mg/dL (ref 0.2–1.2)
BUN: 14 mg/dL (ref 6–23)
CALCIUM: 9.7 mg/dL (ref 8.4–10.5)
CO2: 28 mEq/L (ref 19–32)
Chloride: 104 mEq/L (ref 96–112)
Creatinine, Ser: 1.01 mg/dL (ref 0.40–1.20)
GFR: 61.67 mL/min (ref >60.00)
GLUCOSE: 105 mg/dL — AB (ref 70–99)
POTASSIUM: 4.3 meq/L (ref 3.5–5.1)
Sodium: 139 mEq/L (ref 135–145)
TOTAL PROTEIN: 7.3 g/dL (ref 6.0–8.3)

## 2017-09-02 LAB — LIPID PANEL
Cholesterol: 236 mg/dL — ABNORMAL HIGH (ref 0–200)
HDL: 55.9 mg/dL (ref >39.00)
LDL Cholesterol: 149 mg/dL — ABNORMAL HIGH (ref 0–99)
NONHDL: 179.84
TRIGLYCERIDES: 152 mg/dL — AB (ref 0.0–149.0)
Total CHOL/HDL Ratio: 4
VLDL: 30.4 mg/dL (ref 0.0–40.0)

## 2017-09-02 LAB — TSH: TSH: 4.34 u[IU]/mL (ref 0.35–4.50)

## 2017-09-02 NOTE — Patient Instructions (Signed)
Happy birthday!  Great to see you. I will call you with your lab results from today and you can view them online.   Please call [facility] to schedule your mammogram.  We will call you with your GI appointment.

## 2017-09-02 NOTE — Assessment & Plan Note (Signed)
Pap smear done today, wet prep sent with pap given intermittent vaginal discharge she has noticed. Mammogram ordered- pt to schedule.

## 2017-09-02 NOTE — Assessment & Plan Note (Signed)
Reviewed preventive care protocols, scheduled due services, and updated immunizations Discussed nutrition, exercise, diet, and healthy lifestyle.  Orders Placed This Encounter  Procedures  . MM Digital Diagnostic Bilat  . Tdap vaccine greater than or equal to 50yo IM  . Comprehensive metabolic panel  . Lipid panel  . TSH  . Ambulatory referral to Gastroenterology   tdap today. Referral to GI placed.

## 2017-09-02 NOTE — Progress Notes (Signed)
Subjective:   Patient ID: Carrie Gray, female    DOB: 1965-05-16, 52 y.o.   MRN: [id number]  Carrie Gray is a pleasant 52 y.o. year old female who presents to clinic today with Annual Exam (Patient is here today for a CPE with PAP. She declined STD screening but states that she is having some vaginal discharge.  She is currently fasting.  She agrees to have Mammogram completed at [facility] if orders are placed.  She turns 52 next week and would like a referral to GI for Colonoscopy.  She agrees to get Tdap today.  She declines HIV lab draw.  )  on 09/02/2017  HPI:  Here for CPX/pap smear. Has been having some intermittent vaginal discharge- does not want STD screening.  Sexually active with husband only.  Due for mammogram.  Turns 52 next week and agrees to GI referral for colonoscopy.    Health Maintenance  Topic Date Due  . TETANUS/TDAP  09/08/1986  . PAP SMEAR  08/16/2017  . INFLUENZA VACCINE  10/03/2017 (Originally 08/14/2017)  . HIV Screening  Discontinued   Lab Results  Component Value Date   CHOL 185 08/17/2014   HDL 53.00 08/17/2014   LDLCALC 108 (H) 08/17/2014   TRIG 117.0 08/17/2014   CHOLHDL 3 08/17/2014   Lab Results  Component Value Date   WBC 12.9 (H) 10/18/2016   HGB 12.6 10/18/2016   HCT 36.9 10/18/2016   MCV 89.1 10/18/2016   PLT 209 10/18/2016   Lab Results  Component Value Date   NA 139 10/19/2016   K 4.2 10/19/2016   CL 109 10/19/2016   CO2 23 10/19/2016   Lab Results  Component Value Date   ALT 112 (H) 10/19/2016   AST 73 (H) 10/19/2016   ALKPHOS 50 10/19/2016   BILITOT 0.3 10/19/2016     Current Outpatient Medications on File Prior to Visit  Medication Sig Dispense Refill  . ibuprofen (ADVIL,MOTRIN) 200 MG tablet Take 400-800 mg by mouth every 6 (six) hours as needed for headache or mild pain (2-4 tablets depending on pain).     No current facility-administered medications on file prior to visit.     No Known  Allergies  Past Medical History:  Diagnosis Date  . Cholecystitis     Past Surgical History:  Procedure Laterality Date  . CHOLECYSTECTOMY N/A 10/17/2016   Procedure: LAPAROSCOPIC CHOLECYSTECTOMY WITH INTRAOPERATIVE CHOLANGIOGRAM;  Surgeon: Luretha MurphyMartin, Matthew, MD;  Location: [facility];  Service: General;  Laterality: N/A;  . ENDOSCOPIC RETROGRADE CHOLANGIOPANCREATOGRAPHY (ERCP) WITH PROPOFOL N/A 10/18/2016   Procedure: ENDOSCOPIC RETROGRADE CHOLANGIOPANCREATOGRAPHY (ERCP) WITH PROPOFOL;  Surgeon: Willis Modenautlaw, William, MD;  [facility];  Service: Endoscopy;  Laterality: N/A;  . GANGLION CYST EXCISION Right     Family History  Problem Relation Age of Onset  . Cancer Mother [id number]       colon    Social History   Socioeconomic History  . Marital status: Married    Spouse name: Not on file  . Number of children: Not on file  . Years of education: Not on file  . Highest education level: Not on file  Occupational History  . Not on file  Social Needs  . Financial resource strain: Not on file  . Food insecurity:    Worry: Not on file    Inability: Not on file  . Transportation needs:    Medical: Not on file    Non-medical: Not on file  Tobacco Use  .  Smoking status: Former Games developer  . Smokeless tobacco: Never Used  Substance and Sexual Activity  . Alcohol use: Yes    Alcohol/week: 0.0 standard drinks  . Drug use: No  . Sexual activity: Not on file  Lifestyle  . Physical activity:    Days per week: Not on file    Minutes per session: Not on file  . Stress: Not on file  Relationships  . Social connections:    Talks on phone: Not on file    Gets together: Not on file    Attends religious service: Not on file    Active member of club or organization: Not on file    Attends meetings of clubs or organizations: Not on file    Relationship status: Not on file  . Intimate partner violence:    Fear of current or ex partner: Not on file    Emotionally abused: Not on file     Physically abused: Not on file    Forced sexual activity: Not on file  Other Topics Concern  . Not on file  Social History Narrative   Married, 3 children.   Works at [employer] as Psychologist, counselling.   The PMH, PSH, Social History, Family History, Medications, and allergies have been reviewed in [facility], and have been updated if relevant.  Review of Systems  Constitutional: Negative.   HENT: Negative.   Eyes: Negative.   Respiratory: Negative.   Cardiovascular: Negative.   Gastrointestinal: Negative.   Endocrine: Negative.   Genitourinary: Positive for vaginal discharge. Negative for decreased urine volume, difficulty urinating, flank pain, frequency, hematuria, menstrual problem, pelvic pain, urgency, vaginal bleeding and vaginal pain.  Musculoskeletal: Negative.   Skin: Negative.   Allergic/Immunologic: Negative.   Neurological: Negative.   Hematological: Negative.   Psychiatric/Behavioral: Negative.   All other systems reviewed and are negative.      Objective:    BP 116/74 (BP Location: Left Arm, Patient Position: Sitting, Cuff Size: Normal)   Pulse 80   Temp 98.4 F (36.9 C) (Oral)   Ht 5\' 4"  (1.626 m)   Wt 175 lb 6.4 oz (79.6 kg)   LMP 08/22/2017   SpO2 96%   BMI 30.11 kg/m    Physical Exam    General:  Well-developed,well-nourished,in no acute distress; alert,appropriate and cooperative throughout examination Head:  normocephalic and atraumatic.   Eyes:  vision grossly intact, PERRL Ears:  R ear normal and L ear normal externally, TMs clear bilaterally Nose:  no external deformity.   Mouth:  good dentition.   Neck:  No deformities, masses, or tenderness noted. Breasts:  No mass, nodules, thickening, tenderness, bulging, retraction, inflamation, nipple discharge or skin changes noted.   Lungs:  Normal respiratory effort, chest expands symmetrically. Lungs are clear to auscultation, no crackles or wheezes. Heart:  Normal rate and regular rhythm. S1  and S2 normal without gallop, murmur, click, rub or other extra sounds. Abdomen:  Bowel sounds positive,abdomen soft and non-tender without masses, organomegaly or hernias noted. Rectal:  no external abnormalities.   Genitalia:  Pelvic Exam:        External: normal female genitalia without lesions or masses        Vagina: normal without lesions or masses        Cervix: normal without lesions or masses        Adnexa: normal bimanual exam without masses or fullness        Uterus: normal by palpation  Pap smear: performed Msk:  No deformity or scoliosis noted of thoracic or lumbar spine.   Extremities:  No clubbing, cyanosis, edema, or deformity noted with normal full range of motion of all joints.   Neurologic:  alert & oriented X3 and gait normal.   Skin:  Intact without suspicious lesions or rashes Cervical Nodes:  No lymphadenopathy noted Axillary Nodes:  No palpable lymphadenopathy Psych:  Cognition and judgment appear intact. Alert and cooperative with normal attention span and concentration. No apparent delusions, illusions, hallucinations      Assessment & Plan:   Routine general medical examination at a health care facility - Plan: Cytology - PAP  Need for Tdap vaccination - Plan: Tdap vaccine greater than or equal to 7yo IM  Vaginal discharge - Plan: Cytology - PAP, CANCELED: TSH  Screening for breast cancer - Plan: MM Digital Diagnostic Bilat  Screening for colon cancer - Plan: Ambulatory referral to Gastroenterology  Chronic cholecystitis - Plan: CANCELED: Comprehensive metabolic panel, CANCELED: Lipid panel  Encounter for gynecological examination without abnormal finding  BMI 30.0-30.9,adult - Plan: Lipid panel, Comprehensive metabolic panel, TSH No follow-ups on file.

## 2017-09-03 ENCOUNTER — Other Ambulatory Visit: Payer: Self-pay | Admitting: Family Medicine

## 2017-09-03 DIAGNOSIS — E789 Disorder of lipoprotein metabolism, unspecified: Secondary | ICD-10-CM

## 2017-09-03 NOTE — Addendum Note (Signed)
Addended by: Lerry LinerFREDERICK, Freeland Pracht. M on: 09/03/2017 04:05 PM   Modules accepted: Orders

## 2017-09-04 LAB — CYTOLOGY - PAP
BACTERIAL VAGINITIS: NEGATIVE
CANDIDA VAGINITIS: POSITIVE — AB
HPV (WINDOPATH): NOT DETECTED

## 2017-09-05 ENCOUNTER — Other Ambulatory Visit: Payer: Self-pay | Admitting: Family Medicine

## 2017-09-05 ENCOUNTER — Telehealth: Payer: Self-pay

## 2017-09-05 DIAGNOSIS — R87612 Low grade squamous intraepithelial lesion on cytologic smear of cervix (LGSIL): Secondary | ICD-10-CM

## 2017-09-05 MED ORDER — FLUCONAZOLE 150 MG PO TABS: 150.0000 mg | ORAL_TABLET | Freq: Once | ORAL | 0 refills | Status: AC

## 2017-09-05 NOTE — Telephone Encounter (Signed)
Notes recorded by Dianne DunAron, Talia M, MD on 09/05/2017 at 8:48 AM EDT See below, not sure if I sent you the result note! Sorry! ------  Notes recorded by Dianne DunAron, Talia M, MD on 09/05/2017 at 8:47 AM EDT Please call pt- 1. She did have some low grade atypical changes on her pap smear- this is NOT cancer but these changes do need to be monitored and evaluated with a biopsy of her cervix done by an obgyn- this is a simple, in office procedure (not much more extensive than a pap smear). I will place the referral now.  2. She did show that she has a yeast infection- please send in diflucan 150 mg po x 1 , #1 with no refills after you confirm drug allergies   Per above I called pt and advised/she is in agreement of referral to GYN/sent in Diflucan after confirmation of NKDA/thx dmf

## 2017-09-05 NOTE — Addendum Note (Signed)
Addended by: Lerry LinerFREDERICK, Terresa Marlett. M on: 09/05/2017 10:28 AM   Modules accepted: Orders

## 2017-09-10 ENCOUNTER — Encounter: Payer: Commercial Managed Care - PPO | Admitting: Family Medicine

## 2017-10-03 ENCOUNTER — Encounter: Payer: Self-pay | Admitting: Family Medicine

## 2017-10-09 ENCOUNTER — Ambulatory Visit
Admission: RE | Admit: 2017-10-09 | Discharge: 2017-10-09 | Disposition: A | Discharge status: Home / Self Care | Payer: Commercial Managed Care - PPO | Source: Ambulatory Visit | Attending: Family Medicine | Admitting: Family Medicine

## 2017-10-09 DIAGNOSIS — Z1231 Encounter for screening mammogram for malignant neoplasm of breast: Secondary | ICD-10-CM | POA: Insufficient documentation

## 2017-10-09 DIAGNOSIS — Z1239 Encounter for other screening for malignant neoplasm of breast: Secondary | ICD-10-CM

## 2017-10-22 ENCOUNTER — Encounter: Payer: Commercial Managed Care - PPO | Admitting: Family Medicine

## 2017-11-05 ENCOUNTER — Encounter: Payer: Self-pay | Admitting: Family Medicine

## 2017-11-05 ENCOUNTER — Ambulatory Visit (INDEPENDENT_AMBULATORY_CARE_PROVIDER_SITE_OTHER): Payer: Commercial Managed Care - PPO | Admitting: Family Medicine

## 2017-11-05 ENCOUNTER — Encounter: Payer: Self-pay | Admitting: *Deleted

## 2017-11-05 VITALS — BP 121/77 | HR 86 | Ht 64.0 in | Wt 169.0 lb

## 2017-11-05 DIAGNOSIS — Z8742 Personal history of other diseases of the female genital tract: Secondary | ICD-10-CM

## 2017-11-05 DIAGNOSIS — Z3202 Encounter for pregnancy test, result negative: Secondary | ICD-10-CM

## 2017-11-05 DIAGNOSIS — R87612 Low grade squamous intraepithelial lesion on cytologic smear of cervix (LGSIL): Secondary | ICD-10-CM

## 2017-11-05 LAB — POCT URINE PREGNANCY: PREG TEST UR: NEGATIVE

## 2017-11-05 NOTE — Progress Notes (Signed)
    GYNECOLOGY CLINIC COLPOSCOPY PROCEDURE NOTE  52 y.o. G3P3 here for colposcopy for low-grade squamous intraepithelial neoplasia (LGSIL - encompassing HPV,mild dysplasia,CIN I) pap smear on 09/02/2017. Discussed role for HPV in cervical dysplasia, need for surveillance.  Patient given informed consent, signed copy in the chart, time out was performed.  Placed in lithotomy position. Cervix viewed with speculum and colposcope after application of acetic acid.   Colposcopy adequate? Yes  acetowhite lesion(s) noted at 3 o'clock  Physical Exam  Genitourinary:       corresponding biopsies obtained.  ECC specimen obtained. All specimens were labeled and sent to pathology.    Patient was given post procedure instructions.  Will follow up pathology and manage accordingly; patient will be contacted with results and recommendations.  Routine preventative health maintenance measures emphasized.    Federico Flake, MD, MPH, ABFM Attending Physician Faculty Practice- [facility]

## 2017-11-05 NOTE — Progress Notes (Signed)
Pt had an abnormal pap and has been referred to GYN to get biopsy done.

## 2017-11-05 NOTE — Addendum Note (Signed)
Addended by: Geanie Berlin on: 11/05/2017 03:15 PM   Modules accepted: Orders

## 2018-03-08 DIAGNOSIS — E785 Hyperlipidemia, unspecified: Secondary | ICD-10-CM | POA: Insufficient documentation

## 2018-03-08 NOTE — Progress Notes (Signed)
Subjective:   Patient ID: Carrie Gray, female    DOB: 01/26/1965, 53 y.o.   MRN: [id number]  Carrie Gray is a pleasant 53 y.o. year old female who presents to clinic today with Hyperlipidemia (Patient is here today to F/U with Cholesterol after making some dietary changes.  She is currently fasting. Declines flu shot.  She has lost 12 lbs since last seen.)  on 03/10/2018  HPI:  HLD-  She and her husband completely changed their diets a couple of months ago.   She has already lost 12 pounds!  Decreased fried foods and fats.  She would like her cholesterol rechecked today.  Lab Results  Component Value Date   CHOL 236 (H) 09/02/2017   HDL 55.90 09/02/2017   LDLCALC 149 (H) 09/02/2017   TRIG 152.0 (H) 09/02/2017   CHOLHDL 4 09/02/2017   Current Outpatient Medications on File Prior to Visit  Medication Sig Dispense Refill  . ibuprofen (ADVIL,MOTRIN) 200 MG tablet Take 400-800 mg by mouth every 6 (six) hours as needed for headache or mild pain (2-4 tablets depending on pain).     No current facility-administered medications on file prior to visit.     No Known Allergies  Past Medical History:  Diagnosis Date  . Cholecystitis     Past Surgical History:  Procedure Laterality Date  . CHOLECYSTECTOMY N/A 10/17/2016   Procedure: LAPAROSCOPIC CHOLECYSTECTOMY WITH INTRAOPERATIVE CHOLANGIOGRAM;  Surgeon: Luretha Murphy, MD;  Location: [facility];  Service: General;  Laterality: N/A;  . ENDOSCOPIC RETROGRADE CHOLANGIOPANCREATOGRAPHY (ERCP) WITH PROPOFOL N/A 10/18/2016   Procedure: ENDOSCOPIC RETROGRADE CHOLANGIOPANCREATOGRAPHY (ERCP) WITH PROPOFOL;  Surgeon: Willis Modena, MD;  Location: [facility] ENDOSCOPY;  Service: Endoscopy;  Laterality: N/A;  . GANGLION CYST EXCISION Right     Family History  Problem Relation Age of Onset  . Cancer Mother 70       colon  . Breast cancer Neg Hx     Social History   Socioeconomic History  . Marital status: Married    Spouse name: Not on file   . Number of children: Not on file  . Years of education: Not on file  . Highest education level: Not on file  Occupational History  . Not on file  Social Needs  . Financial resource strain: Not on file  . Food insecurity:    Worry: Not on file    Inability: Not on file  . Transportation needs:    Medical: Not on file    Non-medical: Not on file  Tobacco Use  . Smoking status: Former Games developer  . Smokeless tobacco: Never Used  Substance and Sexual Activity  . Alcohol use: Yes    Alcohol/week: 0.0 standard drinks  . Drug use: No  . Sexual activity: Yes    Birth control/protection: None  Lifestyle  . Physical activity:    Days per week: Not on file    Minutes per session: Not on file  . Stress: Not on file  Relationships  . Social connections:    Talks on phone: Not on file    Gets together: Not on file    Attends religious service: Not on file    Active member of club or organization: Not on file    Attends meetings of clubs or organizations: Not on file    Relationship status: Not on file  . Intimate partner violence:    Fear of current or ex partner: Not on file    Emotionally abused: Not on file  Physically abused: Not on file    Forced sexual activity: Not on file  Other Topics Concern  . Not on file  Social History Narrative   Married, 3 children.   Works at [employer] as Psychologist, counselling.   The PMH, PSH, Social History, Family History, Medications, and allergies have been reviewed in [facility], and have been updated if relevant.  Review of Systems  Constitutional: Negative.   HENT: Negative.   Eyes: Negative.   Respiratory: Negative.   Cardiovascular: Negative.   Gastrointestinal: Negative.   Endocrine: Negative.   Genitourinary: Negative.   Musculoskeletal: Negative.   Allergic/Immunologic: Negative.   Neurological: Negative.   Hematological: Negative.   Psychiatric/Behavioral: Negative.   All other systems reviewed and are negative.        Objective:    BP 102/66 (BP Location: Left Arm, Patient Position: Sitting, Cuff Size: Normal)   Pulse 73   Temp 98.3 F (36.8 C) (Oral)   Ht 5\' 4"  (1.626 m)   Wt 157 lb 6.4 oz (71.4 kg)   LMP 01/01/2018   SpO2 99%   BMI 27.02 kg/m    Physical Exam   General:  Well-developed,well-nourished,in no acute distress; alert,appropriate and cooperative throughout examination Head:  normocephalic and atraumatic.   Eyes:  vision grossly intact, PERRL Ears:  R ear normal and L ear normal externally, TMs clear bilaterally Nose:  no external deformity.   Mouth:  good dentition.   Neck:  No deformities, masses, or tenderness noted. Lungs:  Normal respiratory effort, chest expands symmetrically. Lungs are clear to auscultation, no crackles or wheezes. Heart:  Normal rate and regular rhythm. S1 and S2 normal without gallop, murmur, click, rub or other extra sounds. Abdomen:  Bowel sounds positive,abdomen soft and non-tender without masses, organomegaly or hernias noted. Msk:  No deformity or scoliosis noted of thoracic or lumbar spine.   Extremities:  No clubbing, cyanosis, edema, or deformity noted with normal full range of motion of all joints.   Neurologic:  alert & oriented X3 and gait normal.   Skin:  Intact without suspicious lesions or rashes Cervical Nodes:  No lymphadenopathy noted Axillary Nodes:  No palpable lymphadenopathy Psych:  Cognition and judgment appear intact. Alert and cooperative with normal attention span and concentration. No apparent delusions, illusions, hallucinations       Assessment & Plan:   Hyperlipidemia, unspecified hyperlipidemia type - Plan: Lipid panel, Comprehensive metabolic panel No follow-ups on file.

## 2018-03-10 ENCOUNTER — Ambulatory Visit (INDEPENDENT_AMBULATORY_CARE_PROVIDER_SITE_OTHER): Payer: BLUE CROSS/BLUE SHIELD | Admitting: Family Medicine

## 2018-03-10 ENCOUNTER — Encounter: Payer: Self-pay | Admitting: Family Medicine

## 2018-03-10 VITALS — BP 102/66 | HR 73 | Temp 98.3°F | Ht 64.0 in | Wt 157.4 lb

## 2018-03-10 DIAGNOSIS — E785 Hyperlipidemia, unspecified: Secondary | ICD-10-CM

## 2018-03-10 DIAGNOSIS — Z6827 Body mass index (BMI) 27.0-27.9, adult: Secondary | ICD-10-CM

## 2018-03-10 LAB — COMPREHENSIVE METABOLIC PANEL
ALBUMIN: 4.6 g/dL (ref 3.5–5.2)
ALT: 26 U/L (ref 0–35)
AST: 15 U/L (ref 0–37)
Alkaline Phosphatase: 54 U/L (ref 39–117)
BUN: 16 mg/dL (ref 6–23)
CALCIUM: 9.4 mg/dL (ref 8.4–10.5)
CHLORIDE: 105 meq/L (ref 96–112)
CO2: 29 mEq/L (ref 19–32)
CREATININE: 0.88 mg/dL (ref 0.40–1.20)
GFR: 67.88 mL/min (ref >60.00)
Glucose, Bld: 92 mg/dL (ref 70–99)
Potassium: 4 mEq/L (ref 3.5–5.1)
Sodium: 142 mEq/L (ref 135–145)
Total Bilirubin: 0.5 mg/dL (ref 0.2–1.2)
Total Protein: 7.2 g/dL (ref 6.0–8.3)

## 2018-03-10 LAB — LIPID PANEL
CHOL/HDL RATIO: 3
CHOLESTEROL: 188 mg/dL (ref 0–200)
HDL: 57.9 mg/dL (ref >39.00)
LDL CALC: 112 mg/dL — AB (ref 0–99)
NonHDL: 129.69
TRIGLYCERIDES: 89 mg/dL (ref 0.0–149.0)
VLDL: 17.8 mg/dL (ref 0.0–40.0)

## 2018-03-10 NOTE — Assessment & Plan Note (Signed)
With dietary changes, she is hoping it will have improved today. Will check lipid panel today.

## 2018-03-10 NOTE — Patient Instructions (Signed)
Great to see you. I will call you with your lab results from today and you can view them online.   You look amazing!!

## 2018-03-10 NOTE — Assessment & Plan Note (Signed)
Congratulated her on her great work!

## 2019-02-12 DIAGNOSIS — Z0000 Encounter for general adult medical examination without abnormal findings: Secondary | ICD-10-CM | POA: Insufficient documentation

## 2019-02-12 NOTE — Progress Notes (Signed)
Virtual Visit via Video   Due to the COVID-19 pandemic, this visit was completed with telemedicine (audio/video) technology to reduce patient and provider exposure as well as to preserve personal protective equipment.   I connected with Carrie Gray by a video enabled telemedicine application and verified that I am speaking with the correct person using two identifiers. Location patient: Home Location provider: [facility], Office Persons participating in the virtual visit: Yvette Rack, Arnette Norris, MD   I discussed the limitations of evaluation and management by telemedicine and the availability of in person appointments. The patient expressed understanding and agreed to proceed.  Care Team   Patient Care Team: Lucille Passy, MD as PCP - General (Family Medicine) Caren Macadam, MD as Consulting Physician (Obstetrics and Gynecology)  Subjective:   HPI: Patient is connecting today for a CPE.    CPX- see problem based charting.  Health Maintenance  Topic Date Due  . PAP SMEAR-Modifier  02/15/2019 (Originally 09/03/2018)  . COLONOSCOPY  03/11/2019 (Originally 09/07/2017)  . INFLUENZA VACCINE  04/14/2019 (Originally 08/15/2018)  . MAMMOGRAM  10/10/2019  . TETANUS/TDAP  09/03/2027  . HIV Screening  Discontinued    Review of Systems  Constitutional: Negative.  Negative for fever and malaise/fatigue.  HENT: Negative.  Negative for congestion and hearing loss.   Eyes: Negative for blurred vision, discharge and redness.  Respiratory: Negative.  Negative for cough and shortness of breath.   Cardiovascular: Negative.  Negative for chest pain, palpitations and leg swelling.  Gastrointestinal: Negative.  Negative for abdominal pain and heartburn.  Genitourinary: Negative.  Negative for dysuria.  Musculoskeletal: Negative.  Negative for falls.  Skin: Negative.  Negative for rash.  Neurological: Negative.  Negative for loss of consciousness and headaches.    Endo/Heme/Allergies: Negative.  Does not bruise/bleed easily.  Psychiatric/Behavioral: Negative.  Negative for depression and memory loss.  All other systems reviewed and are negative.    Patient Active Problem List   Diagnosis Date Noted  . Abnormal Pap smear of cervix 02/14/2019  . Well woman exam without gynecological exam 02/12/2019  . HLD (hyperlipidemia) 03/08/2018  . BMI 27.0-27.9,adult 09/02/2017  . Chronic cholecystitis 10/17/2016  . Allergic rhinitis 06/03/2012    Social History   Tobacco Use  . Smoking status: Former Research scientist (life sciences)  . Smokeless tobacco: Never Used  Substance Use Topics  . Alcohol use: Yes    Alcohol/week: 0.0 standard drinks    Current Outpatient Medications:  .  ibuprofen (ADVIL,MOTRIN) 200 MG tablet, Take 400-800 mg by mouth every 6 (six) hours as needed for headache or mild pain (2-4 tablets depending on pain)., Disp: , Rfl:   No Known Allergies  Objective:  Temp (!) 97.3 F (36.3 C) (Temporal)   LMP 05/15/2018   VITALS: Per patient if applicable, see vitals. GENERAL: Alert, appears well and in no acute distress. HEENT: Atraumatic, conjunctiva clear, no obvious abnormalities on inspection of external nose and ears. NECK: Normal movements of the head and neck. CARDIOPULMONARY: No increased WOB. Speaking in clear sentences. I:E ratio WNL.  MS: Moves all visible extremities without noticeable abnormality. PSYCH: Pleasant and cooperative, well-groomed. Speech normal rate and rhythm. Affect is appropriate. Insight and judgement are appropriate. Attention is focused, linear, and appropriate.  NEURO: CN grossly intact. Oriented as arrived to appointment on time with no prompting. Moves both UE equally.  SKIN: No obvious lesions, wounds, erythema, or cyanosis noted on face or hands.  Depression screen [facility] 2/9 03/10/2018 09/02/2017 08/17/2014  Decreased Interest 0 0 0  Down, Depressed, Hopeless 0 0 0  PHQ - 2 Score 0 0 0     . COVID-19 Education: The  signs and symptoms of COVID-19 were discussed with the patient and how to seek care for testing if needed. The importance of social distancing was discussed today. . Reviewed expectations re: course of current medical issues. . Discussed self-management of symptoms. . Outlined signs and symptoms indicating need for more acute intervention. . Patient verbalized understanding and all questions were answered. Marland Kitchen Health Maintenance issues including appropriate healthy diet, exercise, and smoking avoidance were discussed with patient. . See orders for this visit as documented in the electronic medical record.  Ruthe Mannan, MD   Lab Results  Component Value Date   WBC 12.9 (H) 10/18/2016   HGB 12.6 10/18/2016   HCT 36.9 10/18/2016   PLT 209 10/18/2016   GLUCOSE 92 03/10/2018   CHOL 188 03/10/2018   TRIG 89.0 03/10/2018   HDL 57.90 03/10/2018   LDLCALC 112 (H) 03/10/2018   ALT 26 03/10/2018   AST 15 03/10/2018   NA 142 03/10/2018   K 4.0 03/10/2018   CL 105 03/10/2018   CREATININE 0.88 03/10/2018   BUN 16 03/10/2018   CO2 29 03/10/2018   TSH 4.34 09/02/2017    Lab Results  Component Value Date   TSH 4.34 09/02/2017   Lab Results  Component Value Date   WBC 12.9 (H) 10/18/2016   HGB 12.6 10/18/2016   HCT 36.9 10/18/2016   MCV 89.1 10/18/2016   PLT 209 10/18/2016   Lab Results  Component Value Date   NA 142 03/10/2018   K 4.0 03/10/2018   CO2 29 03/10/2018   GLUCOSE 92 03/10/2018   BUN 16 03/10/2018   CREATININE 0.88 03/10/2018   BILITOT 0.5 03/10/2018   ALKPHOS 54 03/10/2018   AST 15 03/10/2018   ALT 26 03/10/2018   PROT 7.2 03/10/2018   ALBUMIN 4.6 03/10/2018   CALCIUM 9.4 03/10/2018   ANIONGAP 7 10/19/2016   GFR 67.88 03/10/2018   Lab Results  Component Value Date   CHOL 188 03/10/2018   Lab Results  Component Value Date   HDL 57.90 03/10/2018   Lab Results  Component Value Date   LDLCALC 112 (H) 03/10/2018   Lab Results  Component Value Date    TRIG 89.0 03/10/2018   Lab Results  Component Value Date   CHOLHDL 3 03/10/2018   No results found for: HGBA1C     Assessment & Plan:   Problem List Items Addressed This Visit      Active Problems   HLD (hyperlipidemia)    Well controlled with diet.  Due for labs- future orders entered.   LDL is at goal on current statin dose, patient has no side effects from medication and LFTS are normal. No changes today.       Relevant Orders   CBC with Differential/Platelet   Comprehensive metabolic panel   Lipid panel   TSH   Well woman exam without gynecological exam - Primary    Reviewed preventive care protocols, scheduled due services, and updated immunizations Discussed nutrition, exercise, diet, and healthy lifestyle.  Mammogram ordered and phone number for breast center given to pt to schedule her own mammogram.  Reviewed options: re: colon cancer screening, including cologuard vs colonoscopy.  Risks and benefits of both were discussed and patient voiced understanding.  Patient elects: cologuard.       Abnormal Pap smear  of cervix    Other Visit Diagnoses    Encounter for screening mammogram for malignant neoplasm of breast       Relevant Orders   MM 3D SCREEN BREAST BILATERAL   Screening for colon cancer       Relevant Orders   Cologuard      I am having Carrie Gray maintain her ibuprofen.  No orders of the defined types were placed in this encounter.    Ruthe Mannan, MD

## 2019-02-12 NOTE — Assessment & Plan Note (Signed)
Well controlled with diet.  Due for labs- future orders entered.   LDL is at goal on current statin dose, patient has no side effects from medication and LFTS are normal. No changes today.

## 2019-02-12 NOTE — Assessment & Plan Note (Addendum)
Reviewed preventive care protocols, scheduled due services, and updated immunizations Discussed nutrition, exercise, diet, and healthy lifestyle.  Mammogram ordered and phone number for breast center given to pt to schedule her own mammogram.  Reviewed options: re: colon cancer screening, including cologuard vs colonoscopy.  Risks and benefits of both were discussed and patient voiced understanding.  Patient elects: cologuard.

## 2019-02-14 DIAGNOSIS — R87619 Unspecified abnormal cytological findings in specimens from cervix uteri: Secondary | ICD-10-CM | POA: Insufficient documentation

## 2019-02-15 ENCOUNTER — Telehealth (INDEPENDENT_AMBULATORY_CARE_PROVIDER_SITE_OTHER): Payer: BC Managed Care – PPO | Admitting: Family Medicine

## 2019-02-15 ENCOUNTER — Encounter: Payer: Self-pay | Admitting: Family Medicine

## 2019-02-15 VITALS — Temp 97.3°F

## 2019-02-15 DIAGNOSIS — E785 Hyperlipidemia, unspecified: Secondary | ICD-10-CM | POA: Diagnosis not present

## 2019-02-15 DIAGNOSIS — Z1231 Encounter for screening mammogram for malignant neoplasm of breast: Secondary | ICD-10-CM

## 2019-02-15 DIAGNOSIS — Z1211 Encounter for screening for malignant neoplasm of colon: Secondary | ICD-10-CM

## 2019-02-15 DIAGNOSIS — R87619 Unspecified abnormal cytological findings in specimens from cervix uteri: Secondary | ICD-10-CM | POA: Diagnosis not present

## 2019-02-15 DIAGNOSIS — Z0000 Encounter for general adult medical examination without abnormal findings: Secondary | ICD-10-CM

## 2019-02-16 ENCOUNTER — Ambulatory Visit
Admission: RE | Admit: 2019-02-16 | Discharge: 2019-02-16 | Disposition: A | Discharge status: Home / Self Care | Payer: BC Managed Care – PPO | Source: Ambulatory Visit | Attending: Family Medicine | Admitting: Family Medicine

## 2019-02-16 DIAGNOSIS — Z1231 Encounter for screening mammogram for malignant neoplasm of breast: Secondary | ICD-10-CM | POA: Insufficient documentation

## 2019-02-17 ENCOUNTER — Other Ambulatory Visit: Payer: Self-pay | Admitting: Family Medicine

## 2019-02-17 DIAGNOSIS — R928 Other abnormal and inconclusive findings on diagnostic imaging of breast: Secondary | ICD-10-CM

## 2019-02-18 ENCOUNTER — Other Ambulatory Visit: Payer: Self-pay

## 2019-02-19 ENCOUNTER — Other Ambulatory Visit (INDEPENDENT_AMBULATORY_CARE_PROVIDER_SITE_OTHER): Payer: BC Managed Care – PPO

## 2019-02-19 DIAGNOSIS — E785 Hyperlipidemia, unspecified: Secondary | ICD-10-CM

## 2019-02-19 LAB — COMPREHENSIVE METABOLIC PANEL
ALT: 34 U/L (ref 0–35)
AST: 19 U/L (ref 0–37)
Albumin: 4.5 g/dL (ref 3.5–5.2)
Alkaline Phosphatase: 53 U/L (ref 39–117)
BUN: 16 mg/dL (ref 6–23)
CO2: 30 mEq/L (ref 19–32)
Calcium: 9.3 mg/dL (ref 8.4–10.5)
Chloride: 104 mEq/L (ref 96–112)
Creatinine, Ser: 0.88 mg/dL (ref 0.40–1.20)
GFR: 67.62 mL/min (ref >60.00)
Glucose, Bld: 93 mg/dL (ref 70–99)
Potassium: 4.5 mEq/L (ref 3.5–5.1)
Sodium: 142 mEq/L (ref 135–145)
Total Bilirubin: 0.4 mg/dL (ref 0.2–1.2)
Total Protein: 6.7 g/dL (ref 6.0–8.3)

## 2019-02-19 LAB — CBC WITH DIFFERENTIAL/PLATELET
Basophils Absolute: 0 10*3/uL (ref 0.0–0.1)
Basophils Relative: 0.6 % (ref 0.0–3.0)
Eosinophils Absolute: 0 10*3/uL (ref 0.0–0.7)
Eosinophils Relative: 1 % (ref 0.0–5.0)
HCT: 40.6 % (ref 36.0–46.0)
Hemoglobin: 13.7 g/dL (ref 12.0–15.0)
Lymphocytes Relative: 39.5 % (ref 12.0–46.0)
Lymphs Abs: 1.8 10*3/uL (ref 0.7–4.0)
MCHC: 33.6 g/dL (ref 30.0–36.0)
MCV: 89.3 fl (ref 78.0–100.0)
Monocytes Absolute: 0.4 10*3/uL (ref 0.1–1.0)
Monocytes Relative: 8.1 % (ref 3.0–12.0)
Neutro Abs: 2.4 10*3/uL (ref 1.4–7.7)
Neutrophils Relative %: 50.8 % (ref 43.0–77.0)
Platelets: 226 10*3/uL (ref 150.0–400.0)
RBC: 4.55 Mil/uL (ref 3.87–5.11)
RDW: 12.6 % (ref 11.5–15.5)
WBC: 4.7 10*3/uL (ref 4.0–10.5)

## 2019-02-19 LAB — LIPID PANEL
Cholesterol: 202 mg/dL — ABNORMAL HIGH (ref 0–200)
HDL: 53.3 mg/dL (ref >39.00)
LDL Cholesterol: 128 mg/dL — ABNORMAL HIGH (ref 0–99)
NonHDL: 148.69
Total CHOL/HDL Ratio: 4
Triglycerides: 101 mg/dL (ref 0.0–149.0)
VLDL: 20.2 mg/dL (ref 0.0–40.0)

## 2019-02-19 LAB — TSH: TSH: 4.06 u[IU]/mL (ref 0.35–4.50)

## 2019-02-26 ENCOUNTER — Ambulatory Visit
Admission: RE | Admit: 2019-02-26 | Discharge: 2019-02-26 | Disposition: A | Discharge status: Home / Self Care | Payer: BC Managed Care – PPO | Source: Ambulatory Visit | Attending: Family Medicine | Admitting: Family Medicine

## 2019-02-26 DIAGNOSIS — R928 Other abnormal and inconclusive findings on diagnostic imaging of breast: Secondary | ICD-10-CM | POA: Diagnosis present

## 2019-03-01 ENCOUNTER — Ambulatory Visit: Payer: BC Managed Care – PPO

## 2019-03-01 ENCOUNTER — Other Ambulatory Visit: Payer: BC Managed Care – PPO

## 2019-03-24 ENCOUNTER — Other Ambulatory Visit: Payer: Self-pay

## 2019-03-24 ENCOUNTER — Ambulatory Visit (INDEPENDENT_AMBULATORY_CARE_PROVIDER_SITE_OTHER): Payer: BC Managed Care – PPO | Admitting: Family Medicine

## 2019-03-24 ENCOUNTER — Encounter: Payer: Self-pay | Admitting: Family Medicine

## 2019-03-24 VITALS — BP 126/81 | HR 69 | Ht 64.0 in | Wt 169.0 lb

## 2019-03-24 DIAGNOSIS — Z124 Encounter for screening for malignant neoplasm of cervix: Secondary | ICD-10-CM

## 2019-03-24 DIAGNOSIS — Z1151 Encounter for screening for human papillomavirus (HPV): Secondary | ICD-10-CM | POA: Diagnosis not present

## 2019-03-24 DIAGNOSIS — Z01419 Encounter for gynecological examination (general) (routine) without abnormal findings: Secondary | ICD-10-CM

## 2019-03-24 NOTE — Progress Notes (Signed)
mammo 02/2019-WNL

## 2019-03-24 NOTE — Progress Notes (Signed)
   GYNECOLOGY PROBLEM  VISIT ENCOUNTER NOTE  Subjective:   Carrie Gray is a 54 y.o. G55P0 female here for a a problem GYN visit.  Current complaints: none, needs pap.   Denies abnormal vaginal bleeding, discharge, pelvic pain, problems with intercourse or other gynecologic concerns.    Gynecologic History No LMP recorded. Patient is perimenopausal. Contraception: none  Health Maintenance Due  Topic Date Due  . PAP SMEAR-Modifier  09/03/2018     The following portions of the patient's history were reviewed and updated as appropriate: allergies, current medications, past family history, past medical history, past social history, past surgical history and problem list.  Review of Systems Pertinent items are noted in HPI.   Objective:  BP 126/81   Pulse 69   Ht 5\' 4"  (1.626 m)   Wt 169 lb (76.7 kg)   BMI 29.01 kg/m  Gen: well appearing, NAD HEENT: no scleral icterus CV: RR Lung: Normal WOB Ext: warm well perfused  PELVIC: Normal appearing external genitalia; normal appearing vaginal mucosa and cervix.  No abnormal discharge noted.  Pap smear obtained.  Normal uterine size, no other palpable masses, no uterine or adnexal tenderness.   Assessment and Plan:  1. Pap smear for cervical cancer screening Pap collected    Please refer to After Visit Summary for other counseling recommendations.   Return in about 1 year (around 03/23/2020).  05/23/2020, MD, MPH, ABFM Attending Physician Faculty Practice- [facility] for [facility]

## 2019-03-24 NOTE — Progress Notes (Signed)
Last pap 09/02/2017 ( CIN-1/HPV  LSIL

## 2019-03-26 LAB — CYTOLOGY - PAP
Comment: NEGATIVE
Diagnosis: NEGATIVE
High risk HPV: NEGATIVE

## 2020-02-01 ENCOUNTER — Encounter: Payer: Self-pay | Admitting: Radiology

## 2020-05-03 IMAGING — MG MM DIGITAL SCREENING BILAT W/ TOMO W/ CAD
8 series · 9 of 24 positions shown · non-contrast
Comparison: Previous exam(s).

CLINICAL DATA: Screening.

EXAM:
DIGITAL SCREENING BILATERAL MAMMOGRAM WITH TOMO AND CAD

[L MLO synth-2D]
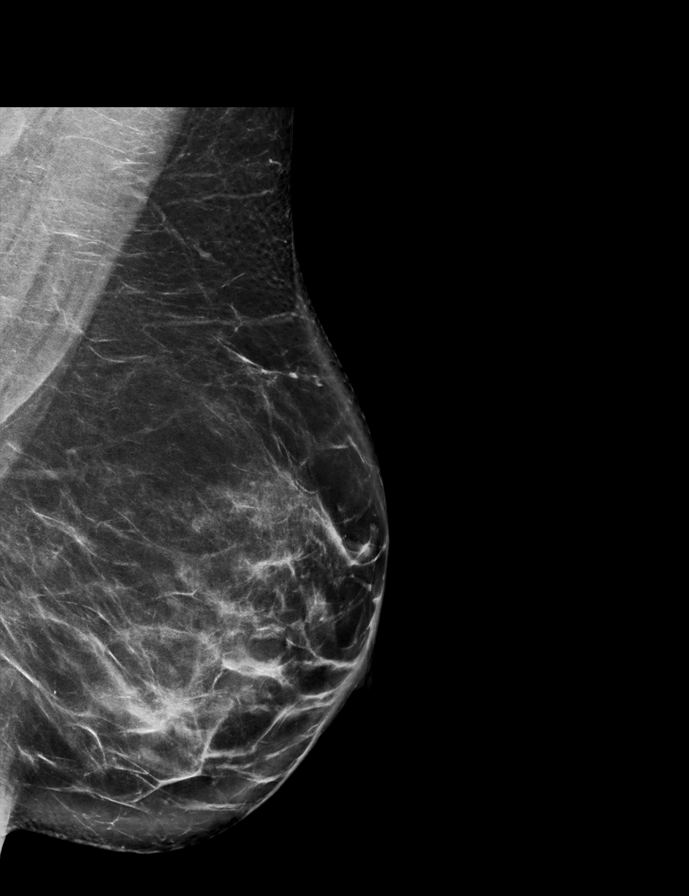

[R MLO synth-2D]
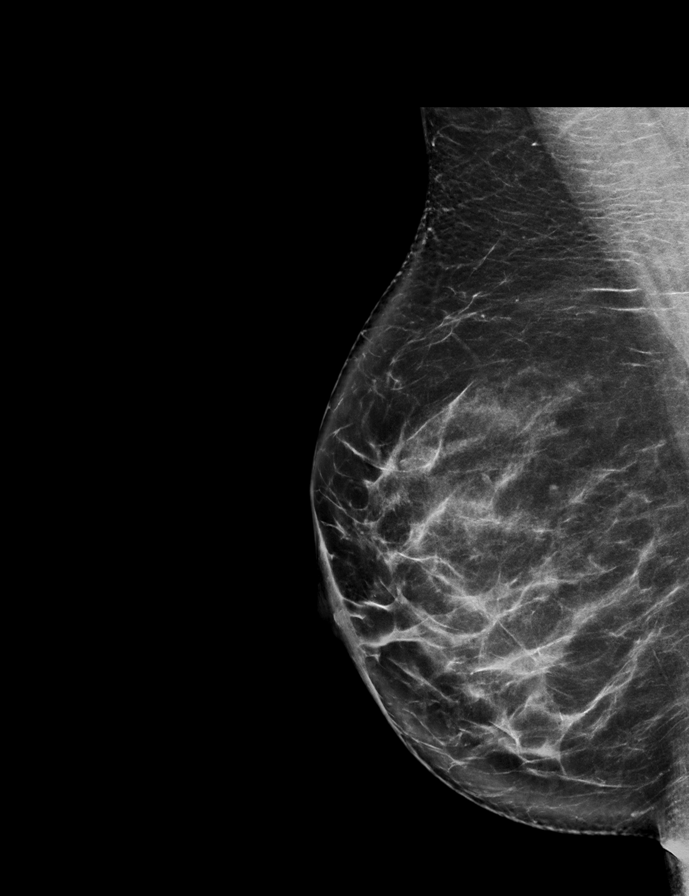

[L CC synth-2D]
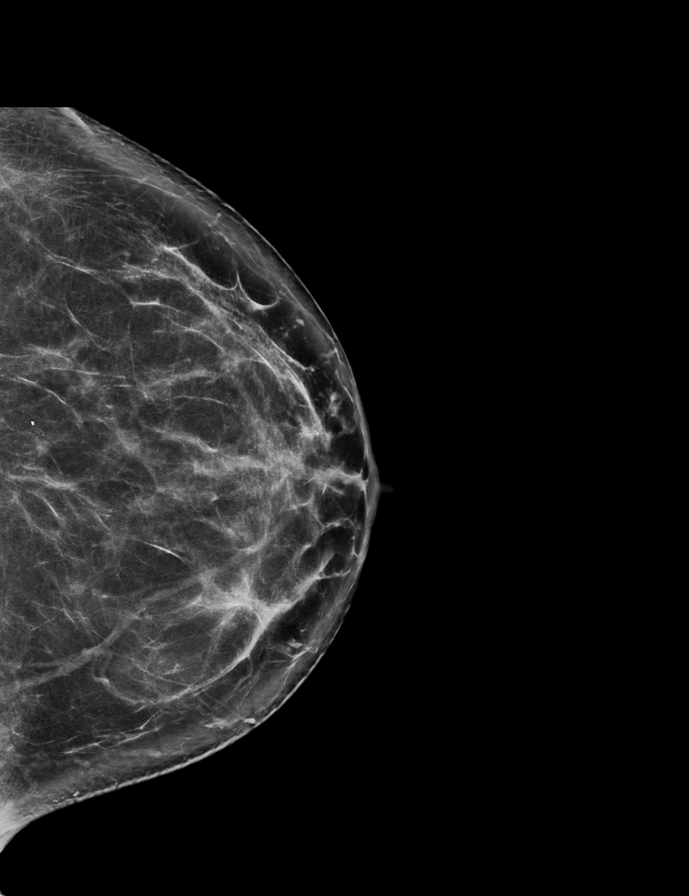

[R CC synth-2D]
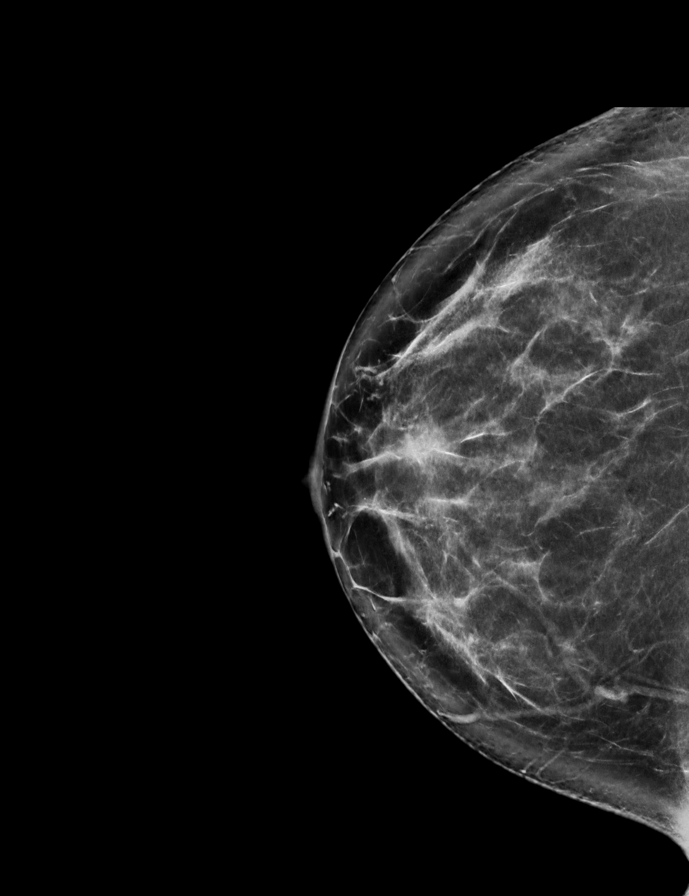

[R CC tomo · 2 of 84 frames shown]
[frame 28/84]
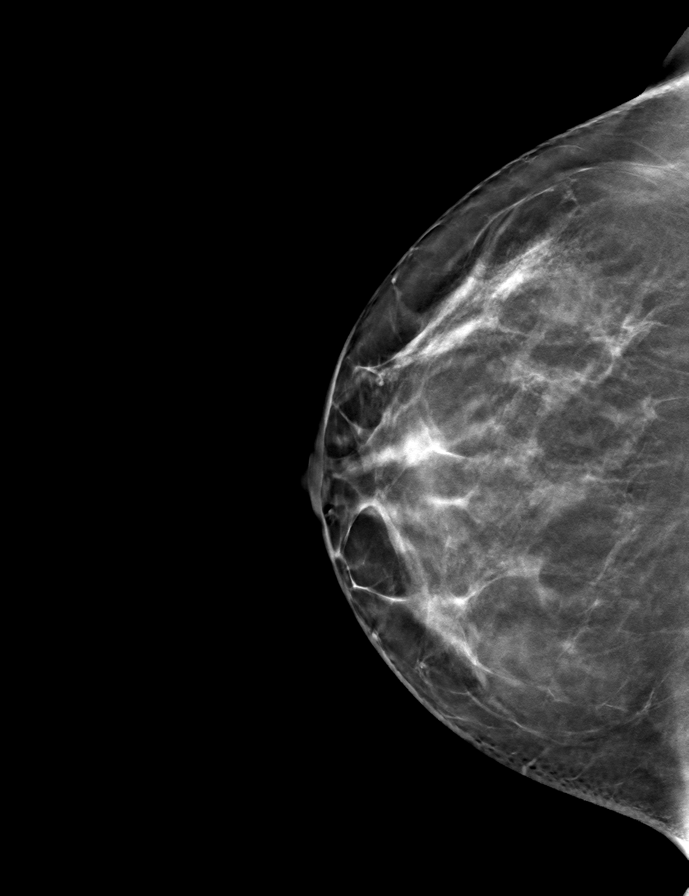
[frame 43/84]
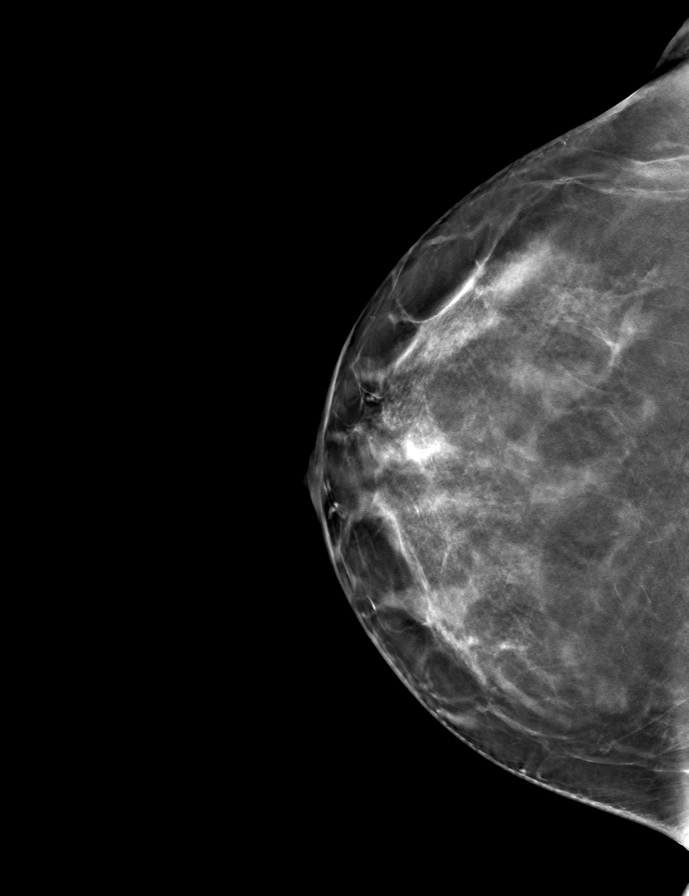

[R MLO tomo · tomo slice 45/88.0]
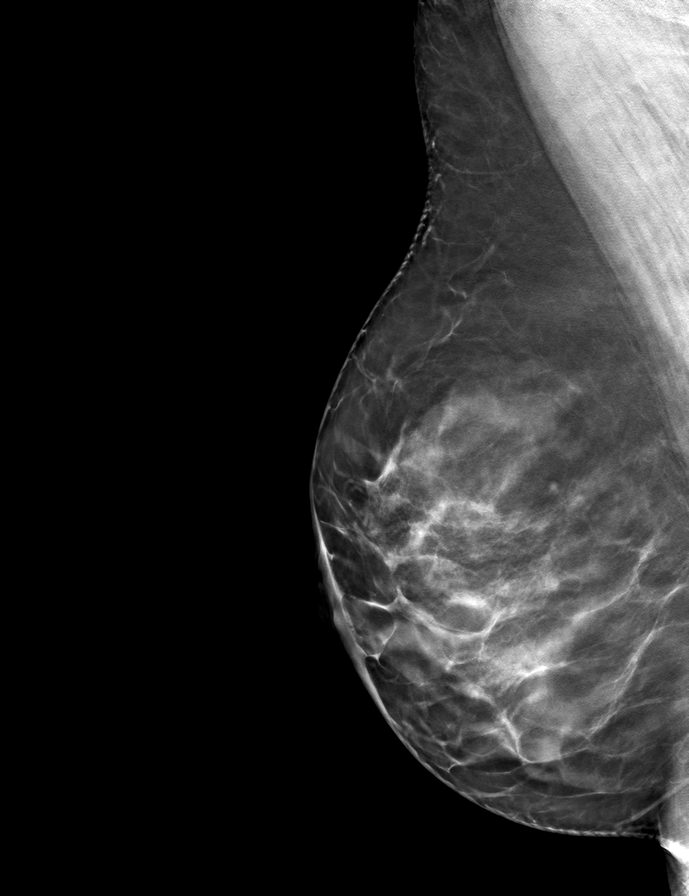

[L MLO tomo · tomo slice 47/93.0]
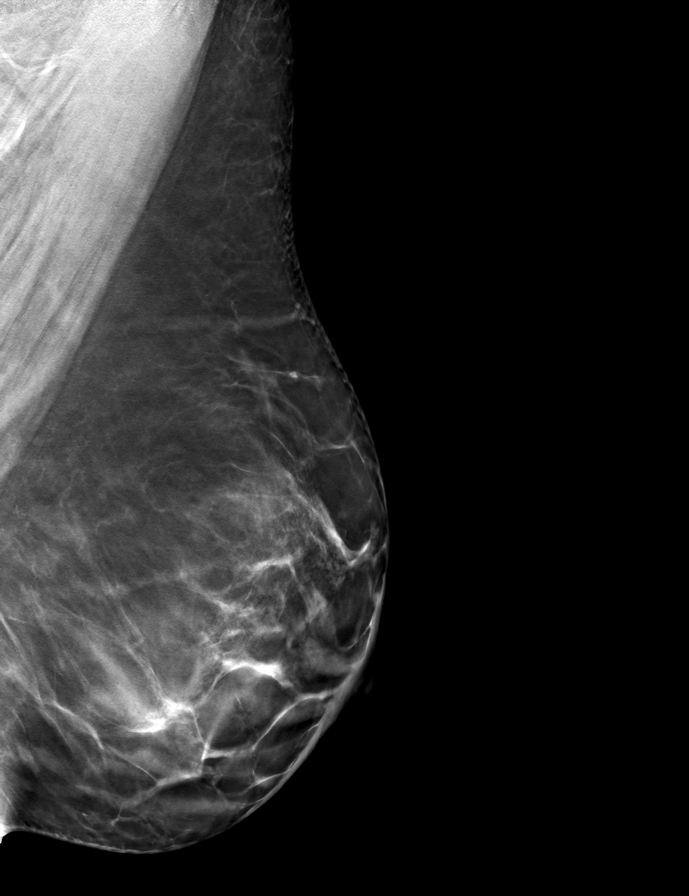

[L CC tomo · tomo slice 42/83.0]
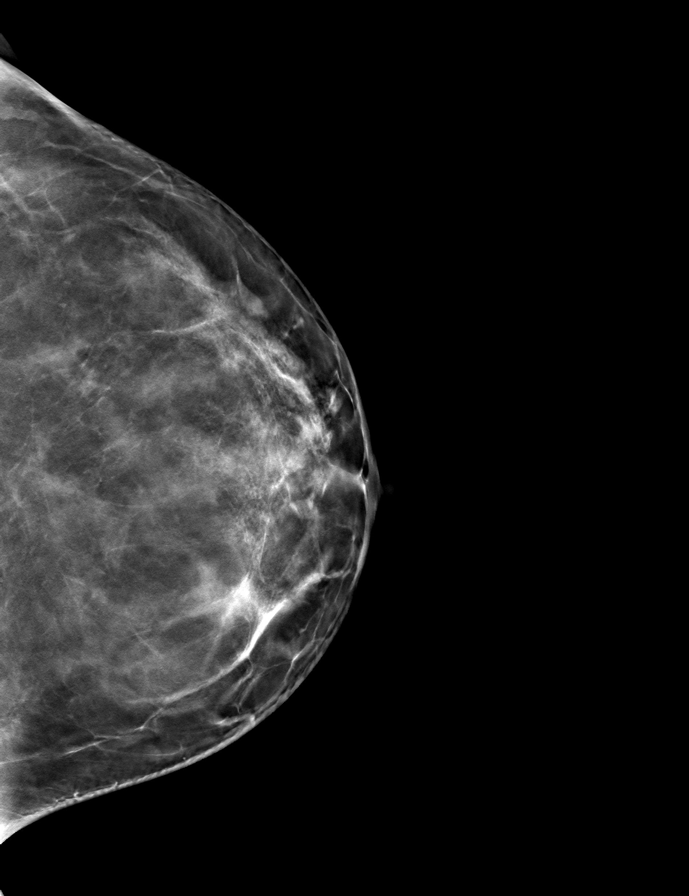

[9 of 24 positions shown; findings below may reference images not displayed]

ACR Breast Density Category b: There are scattered areas of
fibroglandular density.
FINDINGS: There are no findings suspicious for malignancy. Images were
processed with CAD.
IMPRESSION: No mammographic evidence of malignancy. A result letter of this
screening mammogram will be mailed directly to the patient.

RECOMMENDATION:
Screening mammogram in one year. (Code:CN-U-775)

BI-RADS CATEGORY  1: Negative.

## 2020-10-18 ENCOUNTER — Other Ambulatory Visit: Payer: Self-pay

## 2020-10-18 ENCOUNTER — Other Ambulatory Visit (HOSPITAL_COMMUNITY)
Admission: RE | Admit: 2020-10-18 | Discharge: 2020-10-18 | Disposition: A | Discharge status: Home / Self Care | Payer: BLUE CROSS/BLUE SHIELD | Source: Ambulatory Visit | Attending: Family Medicine | Admitting: Family Medicine

## 2020-10-18 ENCOUNTER — Encounter: Payer: Self-pay | Admitting: Family Medicine

## 2020-10-18 ENCOUNTER — Ambulatory Visit (INDEPENDENT_AMBULATORY_CARE_PROVIDER_SITE_OTHER): Payer: BLUE CROSS/BLUE SHIELD | Admitting: Family Medicine

## 2020-10-18 VITALS — BP 119/75 | HR 73 | Ht 64.0 in | Wt 174.0 lb

## 2020-10-18 DIAGNOSIS — Z124 Encounter for screening for malignant neoplasm of cervix: Secondary | ICD-10-CM | POA: Insufficient documentation

## 2020-10-18 DIAGNOSIS — Z01419 Encounter for gynecological examination (general) (routine) without abnormal findings: Secondary | ICD-10-CM | POA: Diagnosis not present

## 2020-10-18 NOTE — Progress Notes (Signed)
Patient presents for Annual Exam.  Last pap: 03/24/19 WNL  Mammogram: 02/26/2019 WNL Family Hx of Breast Cancer: None   CC: None   *Pt consents to student coming in exam room.

## 2020-10-18 NOTE — Progress Notes (Addendum)
GYNECOLOGY CLINIC ANNUAL PREVENTATIVE CARE ENCOUNTER NOTE  Subjective:   Carrie Gray is a 55 y.o. G3P0 female here for a routine annual gynecologic exam.  Current complaints: none. She is wondering if she should be on any medications since she is postmenopausal. She denies any gynecologic concerns.    Gynecologic History Patient's last menstrual period was 05/15/2018. Contraception: post menopausal status Last Pap: 03/24/2019. Results were: normal Last mammogram: 02/26/2019. Results were: normal  Obstetric History OB History  Gravida Para Term Preterm AB Living  3            SAB IAB Ectopic Multiple Live Births          3    # Outcome Date GA Lbr Len/2nd Weight Sex Delivery Anes PTL Lv  3 Gravida           2 Gravida           1 Gravida            Past Medical History:  Diagnosis Date   Cholecystitis    Past Surgical History:  Procedure Laterality Date   CHOLECYSTECTOMY N/A 10/17/2016   Procedure: LAPAROSCOPIC CHOLECYSTECTOMY WITH INTRAOPERATIVE CHOLANGIOGRAM;  Surgeon: Luretha Murphy, MD;  Location: [facility];  Service: General;  Laterality: N/A;   ENDOSCOPIC RETROGRADE CHOLANGIOPANCREATOGRAPHY (ERCP) WITH PROPOFOL N/A 10/18/2016   Procedure: ENDOSCOPIC RETROGRADE CHOLANGIOPANCREATOGRAPHY (ERCP) WITH PROPOFOL;  Surgeon: Willis Modena, MD;  Location: [facility] ENDOSCOPY;  Service: Endoscopy;  Laterality: N/A;   GANGLION CYST EXCISION Right    Current Outpatient Medications on File Prior to Visit  Medication Sig Dispense Refill   ibuprofen (ADVIL,MOTRIN) 200 MG tablet Take 400-800 mg by mouth every 6 (six) hours as needed for headache or mild pain (2-4 tablets depending on pain).     No current facility-administered medications on file prior to visit.   No Known Allergies  Social History   Socioeconomic History   Marital status: Married    Spouse name: Not on file   Number of children: Not on file   Years of education: Not on file   Highest education level: Not on  file  Occupational History   Not on file  Tobacco Use   Smoking status: Former   Smokeless tobacco: Never  Vaping Use   Vaping Use: Never used  Substance and Sexual Activity   Alcohol use: Yes    Comment: social   Drug use: No   Sexual activity: Yes    Birth control/protection: None  Other Topics Concern   Not on file  Social History Narrative   Married, 3 children.   Works at [employer] as Psychologist, counselling.   Social Determinants of Health   Financial Resource Strain: Not on file  Food Insecurity: Not on file  Transportation Needs: Not on file  Physical Activity: Not on file  Stress: Not on file  Social Connections: Not on file  Intimate Partner Violence: Not on file   Family History  Problem Relation Age of Onset   Cancer Mother 73       colon   Breast cancer Neg Hx    The following portions of the patient's history were reviewed and updated as appropriate: allergies, current medications, past family history, past medical history, past social history, past surgical history and problem list.  Review of Systems Pertinent items are noted in HPI.   Objective:  BP 119/75   Pulse 73   Ht 5\' 4"  (1.626 m)   Wt 174 lb (78.9  kg)   LMP 05/15/2018   BMI 29.87 kg/m  CONSTITUTIONAL: Well-developed, well-nourished female in no acute distress.  HENT:  Normocephalic, atraumatic EYES: EOM are grossly normal. No scleral icterus.  NECK: Grossly normal range of motion. SKIN: Skin is warm and dry. No rash noted. Not diaphoretic. No erythema. No pallor. NEUROLGIC: Alert and oriented to person, place, and time. Normal muscle coordination. PSYCHIATRIC: Normal mood and affect. Normal behavior. Normal judgment and thought content. CARDIOVASCULAR: Normal heart rate noted. RESPIRATORY: Effort normal, no problems with respiration noted. BREASTS: Symmetric in size. No masses, skin changes, nipple drainage, or lymphadenopathy. ABDOMEN: No distention noted. PELVIC: Normal  appearing external genitalia; normal appearing vaginal mucosa and cervix.  No abnormal discharge noted.  Pap smear obtained.  Normal uterine size, no other palpable masses, no uterine or adnexal tenderness. MUSCULOSKELETAL: Normal range of motion. No cyanosis, clubbing, or edema.  Assessment:  Annual gynecologic examination with pap smear   Plan:  Will follow up results of pap smear and manage accordingly. Mammogram not due until 2023. Patient has gotten three COVID-19 vaccines. She declines the flu vaccine. Routine preventative health maintenance measures emphasized. Please refer to After Visit Summary for other counseling recommendations.   Janeal Holmes MS3  Attestation of Supervision of Student:  I confirm that I have verified the information documented in the medical student's note and that I have also personally performed the history, physical exam and all medical decision making activities.  I have verified that all services and findings are accurately documented in this student's note; and I agree with management and plan as outlined in the documentation. I have also made any necessary editorial changes.  Federico Flake, MD Center for [employer], [facility] 10/18/2020 2:02 PM

## 2020-10-19 LAB — CYTOLOGY - PAP
Comment: NEGATIVE
Diagnosis: NEGATIVE
High risk HPV: NEGATIVE

## 2020-12-13 ENCOUNTER — Ambulatory Visit (INDEPENDENT_AMBULATORY_CARE_PROVIDER_SITE_OTHER): Payer: BLUE CROSS/BLUE SHIELD | Admitting: Nurse Practitioner

## 2020-12-13 ENCOUNTER — Encounter: Payer: Self-pay | Admitting: Nurse Practitioner

## 2020-12-13 ENCOUNTER — Other Ambulatory Visit: Payer: Self-pay

## 2020-12-13 VITALS — BP 124/72 | HR 70 | Temp 97.8°F | Ht 64.0 in | Wt 174.4 lb

## 2020-12-13 DIAGNOSIS — E7800 Pure hypercholesterolemia, unspecified: Secondary | ICD-10-CM

## 2020-12-13 DIAGNOSIS — Z1231 Encounter for screening mammogram for malignant neoplasm of breast: Secondary | ICD-10-CM | POA: Diagnosis not present

## 2020-12-13 DIAGNOSIS — Z0001 Encounter for general adult medical examination with abnormal findings: Secondary | ICD-10-CM

## 2020-12-13 NOTE — Patient Instructions (Addendum)
Start multivitamin 1tab daily (centrum or nature made brand). Schedule lab appt for fasting blood draw. Need to be fasting 8hrs prior to blood draw. Ok to drink water. Schedule nurse visit for influenza and shingrix vaccines You will be contacted to schedule appt for repeat mammogram. Send copy of cologuard results. Start heart healthy diet and daily exercise (58mns per day, cardio and weight training).  Preventive Care [id number] Old, Female Preventive care refers to lifestyle choices and visits with your health care provider that can promote health and wellness. Preventive care visits are also called wellness exams. What can I expect for my preventive care visit? Counseling Your health care provider may ask you questions about your: Medical history, including: Past medical problems. Family medical history. Pregnancy history. Current health, including: Menstrual cycle. Method of birth control. Emotional well-being. Home life and relationship well-being. Sexual activity and sexual health. Lifestyle, including: Alcohol, nicotine or tobacco, and drug use. Access to firearms. Diet, exercise, and sleep habits. Work and work eStatistician Sunscreen use. Safety issues such as seatbelt and bike helmet use. Physical exam Your health care provider will check your: Height and weight. These may be used to calculate your BMI (body mass index). BMI is a measurement that tells if you are at a healthy weight. Waist circumference. This measures the distance around your waistline. This measurement also tells if you are at a healthy weight and may help predict your risk of certain diseases, such as type 2 diabetes and high blood pressure. Heart rate and blood pressure. Body temperature. Skin for abnormal spots. What immunizations do I need? Vaccines are usually given at various ages, according to a schedule. Your health care provider will recommend vaccines for you based on your age, medical  history, and lifestyle or other factors, such as travel or where you work. What tests do I need? Screening Your health care provider may recommend screening tests for certain conditions. This may include: Lipid and cholesterol levels. Diabetes screening. This is done by checking your blood sugar (glucose) after you have not eaten for a while (fasting). Pelvic exam and Pap test. Hepatitis B test. Hepatitis C test. HIV (human immunodeficiency virus) test. STI (sexually transmitted infection) testing, if you are at risk. Lung cancer screening. Colorectal cancer screening. Mammogram. Talk with your health care provider about when you should start having regular mammograms. This may depend on whether you have a family history of breast cancer. BRCA-related cancer screening. This may be done if you have a family history of breast, ovarian, tubal, or peritoneal cancers. Bone density scan. This is done to screen for osteoporosis. Talk with your health care provider about your test results, treatment options, and if necessary, the need for more tests. Follow these instructions at home: Eating and drinking  Eat a diet that includes fresh fruits and vegetables, whole grains, lean protein, and low-fat dairy products. Take vitamin and mineral supplements as recommended by your health care provider. Do not drink alcohol if: Your health care provider tells you not to drink. You are pregnant, may be pregnant, or are planning to become pregnant. If you drink alcohol: Limit how much you have to 0-1 drink a day. Know how much alcohol is in your drink. In the [place] one drink equals one 12 oz bottle of beer (355 mL), one 5 oz glass of wine (148 mL), or one 1 oz glass of hard liquor (44 mL). Lifestyle Brush your teeth every morning and night with fluoride toothpaste. Floss one time each  day. Exercise for at least 30 minutes 5 or more days each week. Do not use any products that contain nicotine or tobacco.  These products include cigarettes, chewing tobacco, and vaping devices, such as e-cigarettes. If you need help quitting, ask your health care provider. Do not use drugs. If you are sexually active, practice safe sex. Use a condom or other form of protection to prevent STIs. If you do not wish to become pregnant, use a form of birth control. If you plan to become pregnant, see your health care provider for a prepregnancy visit. Take aspirin only as told by your health care provider. Make sure that you understand how much to take and what form to take. Work with your health care provider to find out whether it is safe and beneficial for you to take aspirin daily. Find healthy ways to manage stress, such as: Meditation, yoga, or listening to music. Journaling. Talking to a trusted person. Spending time with friends and family. Minimize exposure to UV radiation to reduce your risk of skin cancer. Safety Always wear your seat belt while driving or riding in a vehicle. Do not drive: If you have been drinking alcohol. Do not ride with someone who has been drinking. When you are tired or distracted. While texting. If you have been using any mind-altering substances or drugs. Wear a helmet and other protective equipment during sports activities. If you have firearms in your house, make sure you follow all gun safety procedures. Seek help if you have been physically or sexually abused. What's next? Visit your health care provider once a year for an annual wellness visit. Ask your health care provider how often you should have your eyes and teeth checked. Stay up to date on all vaccines. This information is not intended to replace advice given to you by your health care provider. Make sure you discuss any questions you have with your health care provider. Document Revised: 06/28/2020 Document Reviewed: 06/28/2020 Elsevier Patient Education  [employer].

## 2020-12-13 NOTE — Progress Notes (Signed)
Subjective:    Patient ID: Carrie Gray, female    DOB: [birth year], 55 y.o.   MRN: [id number]  HPI Patient presents today for CPE  Transfer from Dr. Dayton Martes, last OV 2021  Vision: up to date, use of corrective lens Dental: up to date Diet:regular Exercise:home video Weight:  Wt Readings from Last 3 Encounters:  12/13/20 174 lb 6.4 oz (79.1 kg)  10/18/20 174 lb (78.9 kg)  03/24/19 169 lb (76.7 kg)    Sexual History (orientation,birth control, marital status, STD): no std screen. Up to date with PAP, needs to schedule mammogram  Depression/Suicide: Depression screen [facility] 2/9 12/13/2020 03/10/2018 09/02/2017 08/17/2014  Decreased Interest 0 0 0 0  Down, Depressed, Hopeless 0 0 0 0  PHQ - 2 Score 0 0 0 0   Immunizations: (TDAP, Hep C screen, Pneumovax, Influenza, zoster)  Health Maintenance  Topic Date Due   Zoster (Shingles) Vaccine (1 of 2) Never done   COVID-19 Vaccine (4 - Booster for Pfizer series) 02/26/2020   Flu Shot  08/14/2020   Hepatitis C Screening: USPSTF Recommendation to screen - Ages 18-79 yo.  12/13/2021*   Mammogram  02/15/2021   Cologuard (Stool DNA test)  02/23/2022   Pap Smear  10/19/2023   Tetanus Vaccine  09/03/2027   Pneumococcal Vaccination  Aged Out   HPV Vaccine  Aged Out   HIV Screening  Discontinued  *Topic was postponed. The date shown is not the original due date.   Fall Risk: Fall Risk  12/13/2020 02/15/2019 03/10/2018 09/02/2017  Falls in the past year? 0 0 0 No  Number falls in past yr: 0 - - -  Injury with Fall? 0 - - -  Risk for fall due to : No Fall Risks - - -  Follow up Falls evaluation completed Falls evaluation completed Falls evaluation completed -   Advanced Directive: Advanced Directives 10/18/2016  Does Patient Have a Medical Advance Directive? No  Would patient like information on creating a medical advance directive? No - Patient declined    Medications and allergies reviewed with patient and updated if appropriate.  Patient  Active Problem List   Diagnosis Date Noted   Abnormal Pap smear of cervix 02/14/2019   HLD (hyperlipidemia) 03/08/2018   Chronic cholecystitis 10/17/2016   Allergic rhinitis 06/03/2012   Current Outpatient Medications on File Prior to Visit  Medication Sig Dispense Refill   ibuprofen (ADVIL,MOTRIN) 200 MG tablet Take 400-800 mg by mouth every 6 (six) hours as needed for headache or mild pain (2-4 tablets depending on pain).     No current facility-administered medications on file prior to visit.    Past Medical History:  Diagnosis Date   Cholecystitis     Past Surgical History:  Procedure Laterality Date   CHOLECYSTECTOMY N/A 10/17/2016   Procedure: LAPAROSCOPIC CHOLECYSTECTOMY WITH INTRAOPERATIVE CHOLANGIOGRAM;  Surgeon: Luretha Murphy, MD;  Location: [facility];  Service: General;  Laterality: N/A;   ENDOSCOPIC RETROGRADE CHOLANGIOPANCREATOGRAPHY (ERCP) WITH PROPOFOL N/A 10/18/2016   Procedure: ENDOSCOPIC RETROGRADE CHOLANGIOPANCREATOGRAPHY (ERCP) WITH PROPOFOL;  Surgeon: Willis Modena, MD;  Location: [facility];  Service: Endoscopy;  Laterality: N/A;   GANGLION CYST EXCISION Right     Social History   Socioeconomic History   Marital status: Married    Spouse name: Not on file   Number of children: Not on file   Years of education: Not on file   Highest education level: Not on file  Occupational History   Not on file  Tobacco Use   Smoking status: Former   Smokeless tobacco: Never  Vaping Use   Vaping Use: Never used  Substance and Sexual Activity   Alcohol use: Yes    Comment: social   Drug use: No   Sexual activity: Yes    Birth control/protection: None  Other Topics Concern   Not on file  Social History Narrative   Married, 3 children.   Works at [employer] as Psychologist, counselling.   Social Determinants of Health   Financial Resource Strain: Not on file  Food Insecurity: Not on file  Transportation Needs: Not on file  Physical Activity: Not on  file  Stress: Not on file  Social Connections: Not on file    Family History  Problem Relation Age of Onset   Cancer Mother 80       colon   Breast cancer Neg Hx         Review of Systems  Constitutional:  Negative for fever, malaise/fatigue and weight loss.  HENT:  Negative for congestion and sore throat.   Eyes:        Negative for visual changes  Respiratory:  Negative for cough and shortness of breath.   Cardiovascular:  Negative for chest pain, palpitations and leg swelling.  Gastrointestinal:  Negative for blood in stool, constipation, diarrhea and heartburn.  Genitourinary:  Negative for dysuria, frequency and urgency.  Musculoskeletal:  Negative for falls, joint pain and myalgias.  Skin:  Negative for rash.  Neurological:  Negative for dizziness, sensory change and headaches.  Endo/Heme/Allergies:  Does not bruise/bleed easily.  Psychiatric/Behavioral:  Negative for depression, substance abuse and suicidal ideas. The patient is not nervous/anxious and does not have insomnia.    Objective:   Vitals:   12/13/20 1023  BP: 124/72  Pulse: 70  Temp: 97.8 F (36.6 C)  SpO2: 98%    Body mass index is 29.94 kg/m.   Physical Examination:  Physical Exam Vitals and nursing note reviewed.  Constitutional:      General: She is not in acute distress.    Appearance: She is well-developed.  HENT:     Right Ear: Tympanic membrane, ear canal and external ear normal.     Left Ear: Tympanic membrane, ear canal and external ear normal.  Eyes:     Extraocular Movements: Extraocular movements intact.     Conjunctiva/sclera: Conjunctivae normal.  Cardiovascular:     Rate and Rhythm: Normal rate and regular rhythm.     Heart sounds: Normal heart sounds.  Pulmonary:     Effort: Pulmonary effort is normal. No respiratory distress.     Breath sounds: Normal breath sounds.  Chest:     Chest wall: No tenderness.  Abdominal:     General: Bowel sounds are normal.      Palpations: Abdomen is soft.  Genitourinary:    Comments: Deferred breast and pelvic exam to GYN Musculoskeletal:        General: Normal range of motion.     Cervical back: Normal range of motion and neck supple.     Right lower leg: No edema.     Left lower leg: No edema.  Skin:    General: Skin is warm and dry.  Neurological:     Mental Status: She is alert and oriented to person, place, and time.     Cranial Nerves: No cranial nerve deficit.     Deep Tendon Reflexes: Reflexes are normal and symmetric.  Psychiatric:  Mood and Affect: Mood normal.        Behavior: Behavior normal.        Thought Content: Thought content normal.    ASSESSMENT and PLAN: This visit occurred during the SARS-CoV-2 public health emergency.  Safety protocols were in place, including screening questions prior to the visit, additional usage of staff PPE, and extensive cleaning of exam room while observing appropriate contact time as indicated for disinfecting solutions.   Carrie Gray was seen today for establish care.  Diagnoses and all orders for this visit:  Encounter for preventative adult health care exam with abnormal findings -     Cancel: MM 3D SCREEN BREAST BILATERAL; Future -     Cancel: Comprehensive metabolic panel -     Cancel: CBC with Differential/Platelet -     Cancel: TSH -     Comprehensive metabolic panel; Future -     CBC with Differential/Platelet; Future -     TSH; Future -     MM 3D SCREEN BREAST BILATERAL; Future  Pure hypercholesterolemia -     Cancel: Lipid panel -     Lipid panel; Future  Encounter for screening mammogram for malignant neoplasm of breast -     Cancel: MM 3D SCREEN BREAST BILATERAL; Future -     MM 3D SCREEN BREAST BILATERAL; Future    Start multivitamin 1tab daily (centrum or nature made brand). Schedule lab appt for fasting blood draw. Need to be fasting 8hrs prior to blood draw. Ok to drink water. Schedule nurse visit for influenza and shingrix  vaccines You will be contacted to schedule appt for repeat mammogram. Send copy of cologuard results. Start heart healthy diet and daily exercise ( per day, cardio and weight training).  Problem List Items Addressed This Visit       Other   HLD (hyperlipidemia)   Relevant Orders   Lipid panel   Other Visit Diagnoses     Encounter for preventative adult health care exam with abnormal findings    -  Primary   Relevant Orders   Comprehensive metabolic panel   CBC with Differential/Platelet   TSH   MM 3D SCREEN BREAST BILATERAL   Encounter for screening mammogram for malignant neoplasm of breast       Relevant Orders   MM 3D SCREEN BREAST BILATERAL       Follow up: Return in about 1 year (around 12/13/2021) for CPE (fasting).  Alysia Penna, NP

## 2020-12-20 ENCOUNTER — Other Ambulatory Visit: Payer: Self-pay

## 2020-12-20 ENCOUNTER — Other Ambulatory Visit (INDEPENDENT_AMBULATORY_CARE_PROVIDER_SITE_OTHER): Payer: BLUE CROSS/BLUE SHIELD

## 2020-12-20 DIAGNOSIS — E7800 Pure hypercholesterolemia, unspecified: Secondary | ICD-10-CM | POA: Diagnosis not present

## 2020-12-20 DIAGNOSIS — Z0001 Encounter for general adult medical examination with abnormal findings: Secondary | ICD-10-CM

## 2020-12-20 LAB — CBC WITH DIFFERENTIAL/PLATELET
Basophils Absolute: 0 10*3/uL (ref 0.0–0.1)
Basophils Relative: 0.4 % (ref 0.0–3.0)
Eosinophils Absolute: 0 10*3/uL (ref 0.0–0.7)
Eosinophils Relative: 0.9 % (ref 0.0–5.0)
HCT: 41.2 % (ref 36.0–46.0)
Hemoglobin: 13.8 g/dL (ref 12.0–15.0)
Lymphocytes Relative: 39.8 % (ref 12.0–46.0)
Lymphs Abs: 1.9 10*3/uL (ref 0.7–4.0)
MCHC: 33.5 g/dL (ref 30.0–36.0)
MCV: 89.3 fl (ref 78.0–100.0)
Monocytes Absolute: 0.4 10*3/uL (ref 0.1–1.0)
Monocytes Relative: 8.1 % (ref 3.0–12.0)
Neutro Abs: 2.5 10*3/uL (ref 1.4–7.7)
Neutrophils Relative %: 50.8 % (ref 43.0–77.0)
Platelets: 231 10*3/uL (ref 150.0–400.0)
RBC: 4.61 Mil/uL (ref 3.87–5.11)
RDW: 12.5 % (ref 11.5–15.5)
WBC: 4.9 10*3/uL (ref 4.0–10.5)

## 2020-12-20 LAB — LIPID PANEL
Cholesterol: 227 mg/dL — ABNORMAL HIGH (ref 0–200)
HDL: 57.2 mg/dL (ref >39.00)
LDL Cholesterol: 137 mg/dL — ABNORMAL HIGH (ref 0–99)
NonHDL: 169.83
Total CHOL/HDL Ratio: 4
Triglycerides: 162 mg/dL — ABNORMAL HIGH (ref 0.0–149.0)
VLDL: 32.4 mg/dL (ref 0.0–40.0)

## 2020-12-20 LAB — COMPREHENSIVE METABOLIC PANEL
ALT: 41 U/L — ABNORMAL HIGH (ref 0–35)
AST: 18 U/L (ref 0–37)
Albumin: 4.4 g/dL (ref 3.5–5.2)
Alkaline Phosphatase: 51 U/L (ref 39–117)
BUN: 11 mg/dL (ref 6–23)
CO2: 31 mEq/L (ref 19–32)
Calcium: 9.6 mg/dL (ref 8.4–10.5)
Chloride: 105 mEq/L (ref 96–112)
Creatinine, Ser: 0.9 mg/dL (ref 0.40–1.20)
GFR: 73.1 mL/min (ref >60.00)
Glucose, Bld: 86 mg/dL (ref 70–99)
Potassium: 4.3 mEq/L (ref 3.5–5.1)
Sodium: 142 mEq/L (ref 135–145)
Total Bilirubin: 0.4 mg/dL (ref 0.2–1.2)
Total Protein: 6.6 g/dL (ref 6.0–8.3)

## 2020-12-20 LAB — TSH: TSH: 4.14 u[IU]/mL (ref 0.35–5.50)

## 2021-12-18 ENCOUNTER — Encounter: Payer: BLUE CROSS/BLUE SHIELD | Admitting: Nurse Practitioner

## 2022-01-17 ENCOUNTER — Encounter: Payer: Self-pay | Admitting: Family

## 2022-01-17 ENCOUNTER — Ambulatory Visit (INDEPENDENT_AMBULATORY_CARE_PROVIDER_SITE_OTHER): Payer: BC Managed Care – PPO | Admitting: Family

## 2022-01-17 VITALS — BP 108/72 | HR 73 | Ht 64.0 in | Wt 175.0 lb

## 2022-01-17 DIAGNOSIS — Z1231 Encounter for screening mammogram for malignant neoplasm of breast: Secondary | ICD-10-CM | POA: Diagnosis not present

## 2022-01-17 DIAGNOSIS — Z1211 Encounter for screening for malignant neoplasm of colon: Secondary | ICD-10-CM | POA: Diagnosis not present

## 2022-01-17 DIAGNOSIS — Z1159 Encounter for screening for other viral diseases: Secondary | ICD-10-CM

## 2022-01-17 DIAGNOSIS — Z808 Family history of malignant neoplasm of other organs or systems: Secondary | ICD-10-CM | POA: Diagnosis not present

## 2022-01-17 DIAGNOSIS — E7800 Pure hypercholesterolemia, unspecified: Secondary | ICD-10-CM

## 2022-01-17 DIAGNOSIS — Z0000 Encounter for general adult medical examination without abnormal findings: Secondary | ICD-10-CM | POA: Diagnosis not present

## 2022-01-17 DIAGNOSIS — Z800 Family history of malignant neoplasm of digestive organs: Secondary | ICD-10-CM

## 2022-01-17 DIAGNOSIS — Z13220 Encounter for screening for lipoid disorders: Secondary | ICD-10-CM | POA: Diagnosis not present

## 2022-01-17 DIAGNOSIS — J309 Allergic rhinitis, unspecified: Secondary | ICD-10-CM

## 2022-01-17 DIAGNOSIS — Z5320 Procedure and treatment not carried out because of patient's decision for unspecified reasons: Secondary | ICD-10-CM

## 2022-01-17 LAB — CBC
HCT: 41.3 % (ref 36.0–46.0)
Hemoglobin: 14.2 g/dL (ref 12.0–15.0)
MCHC: 34.5 g/dL (ref 30.0–36.0)
MCV: 88.1 fl (ref 78.0–100.0)
Platelets: 237 10*3/uL (ref 150.0–400.0)
RBC: 4.68 Mil/uL (ref 3.87–5.11)
RDW: 12.7 % (ref 11.5–15.5)
WBC: 4.7 10*3/uL (ref 4.0–10.5)

## 2022-01-17 LAB — BASIC METABOLIC PANEL
BUN: 13 mg/dL (ref 6–23)
CO2: 31 mEq/L (ref 19–32)
Calcium: 9.3 mg/dL (ref 8.4–10.5)
Chloride: 104 mEq/L (ref 96–112)
Creatinine, Ser: 0.87 mg/dL (ref 0.40–1.20)
GFR: 75.57 mL/min (ref >60.00)
Glucose, Bld: 102 mg/dL — ABNORMAL HIGH (ref 70–99)
Potassium: 4.9 mEq/L (ref 3.5–5.1)
Sodium: 141 mEq/L (ref 135–145)

## 2022-01-17 LAB — LIPID PANEL
Cholesterol: 274 mg/dL — ABNORMAL HIGH (ref 0–200)
HDL: 56.5 mg/dL (ref >39.00)
LDL Cholesterol: 187 mg/dL — ABNORMAL HIGH (ref 0–99)
NonHDL: 217.49
Total CHOL/HDL Ratio: 5
Triglycerides: 151 mg/dL — ABNORMAL HIGH (ref 0.0–149.0)
VLDL: 30.2 mg/dL (ref 0.0–40.0)

## 2022-01-17 NOTE — Assessment & Plan Note (Signed)
Referral placed for dermatology

## 2022-01-17 NOTE — Progress Notes (Signed)
Established Patient Office Visit  Subjective:  Patient ID: Carrie Gray, female    DOB: 02/15/1965  Age: 57 y.o. MRN: [id number]  CC:  Chief Complaint  Patient presents with   Establish Care    [facility]    HPI Carrie Gray is here for a transition of care visit as well as CPE physical exam.  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   Prior provider was: Dr. Lorayne Marek  Pt is without acute concerns.   Cologuard 02/24/19  Pmp since age 68  Pap negative 2022, repeat in five years.  chronic concerns:  H/o skin cancer in the family, she would like referral to dermatologist.   H/o colon cancer in family, mom, age 34.   Allergic rhinitis: controlled, intermittent, not at current. Doesn't take daily medications.   Hyperlipidemia: trying to work on a low cholesterol diet but was off a bit during the holidays.   Past Medical History:  Diagnosis Date   Cholecystitis     Past Surgical History:  Procedure Laterality Date   CHOLECYSTECTOMY N/A 10/17/2016   Procedure: LAPAROSCOPIC CHOLECYSTECTOMY WITH INTRAOPERATIVE CHOLANGIOGRAM;  Surgeon: Johnathan Hausen, MD;  Location: [facility];  Service: General;  Laterality: N/A;   ENDOSCOPIC RETROGRADE CHOLANGIOPANCREATOGRAPHY (ERCP) WITH PROPOFOL N/A 10/18/2016   Procedure: ENDOSCOPIC RETROGRADE CHOLANGIOPANCREATOGRAPHY (ERCP) WITH PROPOFOL;  Surgeon: Arta Silence, MD;  Location: [place];  Service: Endoscopy;  Laterality: N/A;   GANGLION CYST EXCISION Right     Family History  Problem Relation Age of Onset   Colon cancer Mother 14   Heart attack Father 17   Healthy Sister    Diabetes Maternal Grandmother    Skin cancer Paternal Uncle    Skin cancer Paternal Uncle    Breast cancer Neg Hx     Social History   Socioeconomic History   Marital status: Married    Spouse name: Not on file   Number of children: 3   Years of education: Not on file   Highest education level: Not on file  Occupational History    Occupation: Building control surveyor for kindergarden    Comment: [employer]  Tobacco Use   Smoking status: Former    Packs/day: 0.50    Years: 23.00    Total pack years: 11.50    Types: Cigarettes    Quit date: 01/11/1999    Years since quitting: 23.0   Smokeless tobacco: Never  Vaping Use   Vaping Use: Never used  Substance and Sexual Activity   Alcohol use: Yes    Comment: social   Drug use: No   Sexual activity: Yes    Partners: Male    Birth control/protection: None, Post-menopausal  Other Topics Concern   Not on file  Social History Narrative   Married, 3 children all girls.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   Social Determinants of Health   Financial Resource Strain: Not on file  Food Insecurity: Not on file  Transportation Needs: Not on file  Physical Activity: Not on file  Stress: Not on file  Social Connections: Not on file  Intimate Partner Violence: Not on file    Outpatient Medications Prior to Visit  Medication Sig Dispense Refill   ibuprofen (ADVIL,MOTRIN) 200 MG tablet Take 400-800 mg by mouth every 6 (six) hours as needed for headache or mild pain (2-4 tablets depending on pain).     No facility-administered medications prior to visit.    No Known Allergies  ROS Review of  Systems  Review of Systems  Respiratory:  Negative for shortness of breath.   Cardiovascular:  Negative for chest pain and palpitations.  Gastrointestinal:  Negative for constipation and diarrhea.  Genitourinary:  Negative for dysuria, frequency and urgency.  Musculoskeletal:  Negative for myalgias.  Psychiatric/Behavioral:  Negative for depression and suicidal ideas.   All other systems reviewed and are negative.    Objective:    Physical Exam Vitals reviewed.  Constitutional:      General: She is not in acute distress.    Appearance: Normal appearance. She is not ill-appearing or toxic-appearing.  HENT:     Right Ear: Tympanic membrane normal.      Left Ear: Tympanic membrane normal.     Mouth/Throat:     Mouth: Mucous membranes are moist.     Pharynx: No pharyngeal swelling.     Tonsils: No tonsillar exudate.  Eyes:     Extraocular Movements: Extraocular movements intact.     Conjunctiva/sclera: Conjunctivae normal.     Pupils: Pupils are equal, round, and reactive to light.  Neck:     Thyroid: No thyroid mass.  Cardiovascular:     Rate and Rhythm: Normal rate and regular rhythm.  Pulmonary:     Effort: Pulmonary effort is normal.     Breath sounds: Normal breath sounds.  Abdominal:     General: Abdomen is flat. Bowel sounds are normal.     Palpations: Abdomen is soft.  Musculoskeletal:        General: Normal range of motion.  Lymphadenopathy:     Cervical:     Right cervical: No superficial cervical adenopathy.    Left cervical: No superficial cervical adenopathy.  Skin:    General: Skin is warm.     Capillary Refill: Capillary refill takes less than 2 seconds.  Neurological:     General: No focal deficit present.     Mental Status: She is alert and oriented to person, place, and time.  Psychiatric:        Mood and Affect: Mood normal.        Behavior: Behavior normal.        Thought Content: Thought content normal.        Judgment: Judgment normal.       BP 108/72   Pulse 73   Ht 5\' 4"  (1.626 m)   Wt 175 lb (79.4 kg)   LMP 05/15/2018   SpO2 96%   BMI 30.04 kg/m  Wt Readings from Last 3 Encounters:  01/17/22 175 lb (79.4 kg)  12/13/20 174 lb 6.4 oz (79.1 kg)  10/18/20 174 lb (78.9 kg)     Health Maintenance Due  Topic Date Due   MAMMOGRAM  02/15/2021    There are no preventive care reminders to display for this patient.  Lab Results  Component Value Date   TSH 4.14 12/20/2020   Lab Results  Component Value Date   WBC 4.9 12/20/2020   HGB 13.8 12/20/2020   HCT 41.2 12/20/2020   MCV 89.3 12/20/2020   PLT 231.0 12/20/2020   Lab Results  Component Value Date   NA 142 12/20/2020   K 4.3  12/20/2020   CO2 31 12/20/2020   GLUCOSE 86 12/20/2020   BUN 11 12/20/2020   CREATININE 0.90 12/20/2020   BILITOT 0.4 12/20/2020   ALKPHOS 51 12/20/2020   AST 18 12/20/2020   ALT 41 (H) 12/20/2020   PROT 6.6 12/20/2020   ALBUMIN 4.4 12/20/2020   CALCIUM 9.6 12/20/2020  ANIONGAP 7 10/19/2016   GFR 73.10 12/20/2020   Lab Results  Component Value Date   CHOL 227 (H) 12/20/2020   Lab Results  Component Value Date   HDL 57.20 12/20/2020   Lab Results  Component Value Date   LDLCALC 137 (H) 12/20/2020   Lab Results  Component Value Date   TRIG 162.0 (H) 12/20/2020   Lab Results  Component Value Date   CHOLHDL 4 12/20/2020   No results found for: "HGBA1C"    Assessment & Plan:   Problem List Items Addressed This Visit       Respiratory   Allergic rhinitis    Stable.          Other   HLD (hyperlipidemia)    Ordered lipid panel, pending results. Work on low cholesterol diet and exercise as tolerated       Family history of nonmelanoma skin cancer - Primary    Referral placed for dermatology       Relevant Orders   Ambulatory referral to Dermatology   Encounter for general adult medical examination without abnormal findings    Patient Counseling(The following topics were reviewed):  Preventative care handout given to pt  Health maintenance and immunizations reviewed. Please refer to Health maintenance section. Pt advised on safe sex, wearing seatbelts in car, and proper nutrition labwork ordered today for annual Dental health: Discussed importance of regular tooth brushing, flossing, and dental visits.       Relevant Orders   Lipid panel   CBC   Basic metabolic panel   RESOLVED: Encounter for hepatitis C screening test for low risk patient   Other Visit Diagnoses     Encounter for screening mammogram for malignant neoplasm of breast       Relevant Orders   MM 3D SCREEN BREAST BILATERAL   Screening for colon cancer       Relevant Orders    Ambulatory referral to Gastroenterology   Family history of colon cancer in mother       Relevant Orders   Ambulatory referral to Gastroenterology   Screening for lipoid disorders       Relevant Orders   Lipid panel   CBC   Basic metabolic panel   Screening for hepatitis C declined           No orders of the defined types were placed in this encounter.   Follow-up: No follow-ups on file.    Mort Sawyers, FNP

## 2022-01-17 NOTE — Patient Instructions (Addendum)
  A referral was placed today to dermatology  Please let us know if you have not heard back within 2 weeks about the referral.  A referral was placed today for Gi to set up your colonoscopy.  Please let us know if you have not heard back within 1 week about your referral.  Welcome to our clinic, I am happy to have you as my new patient. I am excited to continue on this healthcare journey with you.  Stop by the lab prior to leaving today. I will notify you of your results once received.   Please keep in mind Any my chart messages you send have up to a three business day turnaround for a response.  Phone calls may take up to a one full business day turnaround for a  response.   If you need a medication refill I recommend you request it through the pharmacy as this is easiest for [place] rather than sending a message and or phone call.   Due to recent changes in healthcare laws, you may see results of your imaging and/or laboratory studies on MyChart before I have had a chance to review them.  I understand that in some cases there may be results that are confusing or concerning to you. Please understand that not all results are received at the same time and often I may need to interpret multiple results in order to provide you with the best plan of care or course of treatment. Therefore, I ask that you please give me 2 business days to thoroughly review all your results before contacting my office for clarification. Should we see a critical lab result, you will be contacted sooner.   It was a pleasure seeing you today! Please do not hesitate to reach out with any questions and or concerns.  Regards,   Eugenia Pancoast FNP-C

## 2022-01-17 NOTE — Assessment & Plan Note (Signed)
Ordered lipid panel, pending results. Work on low cholesterol diet and exercise as tolerated ? ?

## 2022-01-17 NOTE — Progress Notes (Signed)
Cholesterol is much too high the LDL is at 187 this is an increase from 1 year ago when it was 137.  I highly suggest that we start a cholesterol medication as this is very very high.  Is patient willing to start?

## 2022-01-17 NOTE — Assessment & Plan Note (Signed)
Stable

## 2022-01-17 NOTE — Assessment & Plan Note (Signed)
Patient Counseling(The following topics were reviewed): ? Preventative care handout given to pt  ?Health maintenance and immunizations reviewed. Please refer to Health maintenance section. ?Pt advised on safe sex, wearing seatbelts in car, and proper nutrition ?labwork ordered today for annual ?Dental health: Discussed importance of regular tooth brushing, flossing, and dental visits. ? ? ?

## 2022-01-18 ENCOUNTER — Other Ambulatory Visit: Payer: Self-pay | Admitting: Family

## 2022-01-18 DIAGNOSIS — E7800 Pure hypercholesterolemia, unspecified: Secondary | ICD-10-CM

## 2022-01-18 DIAGNOSIS — E782 Mixed hyperlipidemia: Secondary | ICD-10-CM

## 2022-01-18 MED ORDER — ATORVASTATIN CALCIUM 10 MG PO TABS: 10.0000 mg | ORAL_TABLET | Freq: Every day | ORAL | 3 refills | Status: DC

## 2022-01-18 NOTE — Progress Notes (Signed)
+  ok great news! Sent in lipitor 10 mg to pharmacy. Have pt make three month f/u in office and we will also repeat FASTING labs at that visit.

## 2022-01-23 ENCOUNTER — Other Ambulatory Visit: Payer: Self-pay

## 2022-01-23 ENCOUNTER — Telehealth: Payer: Self-pay

## 2022-01-23 DIAGNOSIS — Z800 Family history of malignant neoplasm of digestive organs: Secondary | ICD-10-CM

## 2022-01-23 DIAGNOSIS — Z1211 Encounter for screening for malignant neoplasm of colon: Secondary | ICD-10-CM

## 2022-01-23 NOTE — Telephone Encounter (Signed)
Gastroenterology Pre-Procedure Review  Request Date: 03/25 Requesting Physician: Dr. Marius Ditch  Prep will need to be sent to patients mail order.  She will call back with mail order information.  PATIENT REVIEW QUESTIONS: The patient responded to the following health history questions as indicated:    1. Are you having any GI issues? no 2. Do you have a personal history of Polyps? no 3. Do you have a family history of Colon Cancer or Polyps? yes (mother colon cancer) 4. Diabetes Mellitus? no 5. Joint replacements in the past 12 months?no 6. Major health problems in the past 3 months?no 7. Any artificial heart valves, MVP, or defibrillator?no    MEDICATIONS & ALLERGIES:    Patient reports the following regarding taking any anticoagulation/antiplatelet therapy:   Plavix, Coumadin, Eliquis, Xarelto, Lovenox, Pradaxa, Brilinta, or Effient? no Aspirin? no  Patient confirms/reports the following medications:  Current Outpatient Medications  Medication Sig Dispense Refill   atorvastatin (LIPITOR) 10 MG tablet Take 1 tablet (10 mg total) by mouth daily. 90 tablet 3   No current facility-administered medications for this visit.    Patient confirms/reports the following allergies:  No Known Allergies  No orders of the defined types were placed in this encounter.   AUTHORIZATION INFORMATION Primary Insurance: 1D#: Group #:  Secondary Insurance: 1D#: Group #:  SCHEDULE INFORMATION: Date: 04/08/22 Time: Location:

## 2022-01-23 NOTE — Telephone Encounter (Signed)
Pt returned call and is ready to schedule Colonoscopy. Please call her today AFTER 3pm at [phone].

## 2022-02-21 MED ORDER — NA SULFATE-K SULFATE-MG SULF 17.5-3.13-1.6 GM/177ML PO SOLN: 1.0000 | Freq: Once | ORAL | 0 refills | Status: AC

## 2022-02-21 NOTE — Telephone Encounter (Signed)
Patient called back to request her Suprep rx to be sent to [facility].  She provided me with fax # [phone].  I contacted [facility] and was given the fax number corresponding with the one in Epic and sent electronically.  Pts ID # [id number] Rx BIN [id number] Group: Rosanne Sack  Thanks,  Sharyn Lull, CMA

## 2022-02-21 NOTE — Addendum Note (Signed)
Addended by: Vanetta Mulders on: 02/21/2022 04:33 PM   Modules accepted: Orders

## 2022-04-08 ENCOUNTER — Other Ambulatory Visit: Payer: Self-pay

## 2022-04-08 ENCOUNTER — Ambulatory Visit
Admission: RE | Admit: 2022-04-08 | Discharge: 2022-04-08 | Disposition: A | Discharge status: Home / Self Care | Payer: BC Managed Care – PPO | Attending: Gastroenterology | Admitting: Gastroenterology

## 2022-04-08 ENCOUNTER — Encounter
Admission: RE | Disposition: A | Discharge status: Home / Self Care | Payer: Self-pay | Source: Home / Self Care | Attending: Gastroenterology

## 2022-04-08 ENCOUNTER — Ambulatory Visit: Payer: BC Managed Care – PPO | Admitting: Certified Registered"

## 2022-04-08 ENCOUNTER — Encounter: Payer: Self-pay | Admitting: Gastroenterology

## 2022-04-08 DIAGNOSIS — Z79899 Other long term (current) drug therapy: Secondary | ICD-10-CM | POA: Diagnosis not present

## 2022-04-08 DIAGNOSIS — Z87891 Personal history of nicotine dependence: Secondary | ICD-10-CM | POA: Diagnosis not present

## 2022-04-08 DIAGNOSIS — Z800 Family history of malignant neoplasm of digestive organs: Secondary | ICD-10-CM

## 2022-04-08 DIAGNOSIS — D128 Benign neoplasm of rectum: Secondary | ICD-10-CM | POA: Diagnosis not present

## 2022-04-08 DIAGNOSIS — K621 Rectal polyp: Secondary | ICD-10-CM | POA: Diagnosis not present

## 2022-04-08 DIAGNOSIS — Z1211 Encounter for screening for malignant neoplasm of colon: Secondary | ICD-10-CM | POA: Diagnosis not present

## 2022-04-08 HISTORY — PX: COLONOSCOPY WITH PROPOFOL: SHX5780

## 2022-04-08 SURGERY — COLONOSCOPY WITH PROPOFOL
Anesthesia: General

## 2022-04-08 MED ORDER — ONDANSETRON HCL 4 MG PO TABS: 4.0000 mg | ORAL_TABLET | Freq: Three times a day (TID) | ORAL | 0 refills | Status: DC | PRN

## 2022-04-08 MED ORDER — SODIUM CHLORIDE 0.9 % IV SOLN
INTRAVENOUS | Status: DC
  Administered 2022-04-08: 20 mL/h via INTRAVENOUS

## 2022-04-08 MED ORDER — LIDOCAINE HCL (CARDIAC) PF 100 MG/5ML IV SOSY
PREFILLED_SYRINGE | INTRAVENOUS | Status: DC | PRN
  Administered 2022-04-08: 50 mg via INTRAVENOUS

## 2022-04-08 MED ORDER — SUTAB 1479-225-188 MG PO TABS: 12.0000 | ORAL_TABLET | Freq: Once | ORAL | 0 refills | Status: AC

## 2022-04-08 MED ORDER — PROPOFOL 500 MG/50ML IV EMUL
INTRAVENOUS | Status: DC | PRN
  Administered 2022-04-08: 150 ug/kg/min via INTRAVENOUS

## 2022-04-08 MED ORDER — PROPOFOL 10 MG/ML IV BOLUS
INTRAVENOUS | Status: DC | PRN
  Administered 2022-04-08: 60 mg via INTRAVENOUS

## 2022-04-08 MED ORDER — NA SULFATE-K SULFATE-MG SULF 17.5-3.13-1.6 GM/177ML PO SOLN: 354.0000 mL | Freq: Once | ORAL | 0 refills | Status: AC

## 2022-04-08 NOTE — H&P (Signed)
Cephas Darby, MD [address]  [place]  [place], [place] [address]  Main: [phone]  Fax: [phone] Pager: [phone]  Primary Care Physician:  Eugenia Pancoast, FNP Primary Gastroenterologist:  Dr. Cephas Darby  Pre-Procedure History & Physical: HPI:  Carrie Gray is a 57 y.o. female is here for an colonoscopy.   Past Medical History:  Diagnosis Date   Cholecystitis     Past Surgical History:  Procedure Laterality Date   CHOLECYSTECTOMY N/A 10/17/2016   Procedure: LAPAROSCOPIC CHOLECYSTECTOMY WITH INTRAOPERATIVE CHOLANGIOGRAM;  Surgeon: Johnathan Hausen, MD;  Location: [facility];  Service: General;  Laterality: N/A;   ENDOSCOPIC RETROGRADE CHOLANGIOPANCREATOGRAPHY (ERCP) WITH PROPOFOL N/A 10/18/2016   Procedure: ENDOSCOPIC RETROGRADE CHOLANGIOPANCREATOGRAPHY (ERCP) WITH PROPOFOL;  Surgeon: Arta Silence, MD;  Location: [facility];  Service: Endoscopy;  Laterality: N/A;   GANGLION CYST EXCISION Right     Prior to Admission medications   Medication Sig Start Date End Date Taking? Authorizing Provider  atorvastatin (LIPITOR) 10 MG tablet Take 1 tablet (10 mg total) by mouth daily. 01/18/22   Eugenia Pancoast, FNP    Allergies as of 01/23/2022   (No Known Allergies)    Family History  Problem Relation Age of Onset   Colon cancer Mother 62   Heart attack Father 52   Healthy Sister    Diabetes Maternal Grandmother    Skin cancer Paternal Uncle    Skin cancer Paternal Uncle    Breast cancer Neg Hx     Social History   Socioeconomic History   Marital status: Married    Spouse name: Not on file   Number of children: 3   Years of education: Not on file   Highest education level: Not on file  Occupational History   Occupation: Building control surveyor for kindergarden    Comment: [employer]  Tobacco Use   Smoking status: Former    Packs/day: 0.50    Years: 23.00    Additional pack years: 0.00    Total pack years: 11.50    Types: Cigarettes    Quit  date: 01/11/1999    Years since quitting: 23.2   Smokeless tobacco: Never  Vaping Use   Vaping Use: Never used  Substance and Sexual Activity   Alcohol use: Yes    Comment: social   Drug use: No   Sexual activity: Yes    Partners: Male    Birth control/protection: None, Post-menopausal  Other Topics Concern   Not on file  Social History Narrative   Married, 3 children all girls.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   Social Determinants of Health   Financial Resource Strain: Not on file  Food Insecurity: Not on file  Transportation Needs: Not on file  Physical Activity: Not on file  Stress: Not on file  Social Connections: Not on file  Intimate Partner Violence: Not on file    Review of Systems: See HPI, otherwise negative ROS  Physical Exam: BP 114/77   Pulse 81   Temp (!) 96.2 F (35.7 C)   Resp 20   Ht 5\' 4"  (1.626 m)   Wt 78.1 kg   LMP 05/15/2018   SpO2 100%   BMI 29.56 kg/m  General:   Alert,  pleasant and cooperative in NAD Head:  Normocephalic and atraumatic. Neck:  Supple; no masses or thyromegaly. Lungs:  Clear throughout to auscultation.    Heart:  Regular rate and rhythm. Abdomen:  Soft, nontender and nondistended. Normal bowel sounds, without guarding, and  without rebound.   Neurologic:  Alert and  oriented x4;  grossly normal neurologically.  Impression/Plan: Carrie Gray is here for an colonoscopy to be performed for colon cancer screening  Risks, benefits, limitations, and alternatives regarding  colonoscopy have been reviewed with the patient.  Questions have been answered.  All parties agreeable.   Sherri Sear, MD  04/08/2022, 9:34 AM

## 2022-04-08 NOTE — Transfer of Care (Signed)
Immediate Anesthesia Transfer of Care Note  Patient: Carrie Gray  Procedure(s) Performed: COLONOSCOPY WITH PROPOFOL  Patient Location: PACU and Endoscopy Unit  Anesthesia Type:General  Level of Consciousness: drowsy and patient cooperative  Airway & Oxygen Therapy: Patient Spontanous Breathing  Post-op Assessment: Report given to RN and Post -op Vital signs reviewed and stable  Post vital signs: Reviewed and stable  Last Vitals:  Vitals Value Taken Time  BP 99/62 04/08/22 1004  Temp    Pulse 75 04/08/22 1005  Resp 19 04/08/22 1005  SpO2 100 % 04/08/22 1005  Vitals shown include unvalidated device data.  Last Pain:  Vitals:   04/08/22 0921  PainSc: 0-No pain         Complications: No notable events documented.

## 2022-04-08 NOTE — Op Note (Signed)
[facility] Gastroenterology Patient Name: Carrie Gray Procedure Date: 04/08/2022 9:36 AM MRN: [id number] Account #: [id number] Date of Birth: July 16, 1965 Admit Type: Outpatient Age: 57 Room: [facility] ENDO ROOM 4 Gender: Female Note Status: Finalized Instrument Name: Park Meo [id number] Procedure:             Colonoscopy Indications:           Screening for colorectal malignant neoplasm, This is                         the patient's first colonoscopy Providers:             Lin Landsman MD, MD Referring MD:          Lin Landsman MD, MD (Referring MD) Medicines:             General Anesthesia Complications:         No immediate complications. Estimated blood loss: None. Procedure:             Pre-Anesthesia Assessment:                        - Prior to the procedure, a History and Physical was                         performed, and patient medications and allergies were                         reviewed. The patient is competent. The risks and                         benefits of the procedure and the sedation options and                         risks were discussed with the patient. All questions                         were answered and informed consent was obtained.                         Patient identification and proposed procedure were                         verified by the physician, the nurse, the                         anesthesiologist, the anesthetist and the technician                         in the pre-procedure area in the procedure room in the                         endoscopy suite. Mental Status Examination: alert and                         oriented. Airway Examination: normal oropharyngeal                         airway and neck mobility. Respiratory Examination:  clear to auscultation. CV Examination: normal.                         Prophylactic Antibiotics: The patient does not require                          prophylactic antibiotics. Prior Anticoagulants: The                         patient has taken no anticoagulant or antiplatelet                         agents. ASA Grade Assessment: II - A patient with mild                         systemic disease. After reviewing the risks and                         benefits, the patient was deemed in satisfactory                         condition to undergo the procedure. The anesthesia                         plan was to use general anesthesia. Immediately prior                         to administration of medications, the patient was                         re-assessed for adequacy to receive sedatives. The                         heart rate, respiratory rate, oxygen saturations,                         blood pressure, adequacy of pulmonary ventilation, and                         response to care were monitored throughout the                         procedure. The physical status of the patient was                         re-assessed after the procedure.                        After obtaining informed consent, the colonoscope was                         passed under direct vision. Throughout the procedure,                         the patient's blood pressure, pulse, and oxygen                         saturations were monitored continuously. The  Colonoscope was introduced through the anus with the                         intention of advancing to the cecum. The scope was                         advanced to the descending colon before the procedure                         was aborted. Medications were given. The colonoscopy                         was extremely difficult due to inadequate bowel prep.                         The patient tolerated the procedure well. The quality                         of the bowel preparation was poor. The colonoscopy was                         aborted due to the extreme difficulty of the  procedure. Findings:      The perianal and digital rectal examinations were normal. Pertinent       negatives include normal sphincter tone and no palpable rectal lesions.      Copious quantities of semi-liquidmuddy brown stool was found in the       recto-sigmoid colon, in the sigmoid colon and in the descending colon,       precluding visualization.      A 5 mm polyp was found in the distal rectum. The polyp was sessile. The       polyp was removed with a cold snare. Resection and retrieval were       complete. Estimated blood loss: none.      The retroflexed view of the distal rectum and anal verge was normal and       showed no anal or rectal abnormalities. Impression:            - Preparation of the colon was poor.                        - The procedure was aborted due to the extreme                         difficulty of the procedure.                        - Stool in the recto-sigmoid colon, in the sigmoid                         colon and in the descending colon.                        - One 5 mm polyp in the distal rectum, removed with a                         cold snare. Resected and retrieved.                        -  The distal rectum and anal verge are normal on                         retroflexion view. Recommendation:        - Discharge patient to home (with escort).                        - Resume previous diet today.                        - Continue present medications.                        - Await pathology results.                        - Repeat colonoscopy either tomorrow or at next                         available appointment (within 3 months) because the                         bowel preparation was poor. Patient vomited second                         bottle of bowel prep. Try repeat prep with sutab or                         liquid prep along with zofran Procedure Code(s):     --- Professional ---                        [phone], 52, Colonoscopy, flexible;  with removal of                         tumor(s), polyp(s), or other lesion(s) by snare                         technique Diagnosis Code(s):     --- Professional ---                        Z12.11, Encounter for screening for malignant neoplasm                         of colon                        D12.8, Benign neoplasm of rectum CPT copyright 2022 American Medical Association. All rights reserved. The codes documented in this report are preliminary and upon coder review may  be revised to meet current compliance requirements. Dr. Ulyess Mort Lin Landsman MD, MD 04/08/2022 10:04:31 AM This report has been signed electronically. Number of Addenda: 0 Note Initiated On: 04/08/2022 9:36 AM Total Procedure Duration: 0 hours 6 minutes 31 seconds  Estimated Blood Loss:  Estimated blood loss: none.      [facility]

## 2022-04-08 NOTE — Anesthesia Preprocedure Evaluation (Signed)
Anesthesia Evaluation  Patient identified by MRN, date of birth, ID band Patient awake    Reviewed: Allergy & Precautions, NPO status , Patient's Chart, lab work & pertinent test results  History of Anesthesia Complications Negative for: history of anesthetic complications  Airway Mallampati: II  TM Distance: >3 FB Neck ROM: Full    Dental no notable dental hx. (+) Teeth Intact   Pulmonary neg pulmonary ROS, neg sleep apnea, neg COPD, Patient abstained from smoking.Not current smoker, former smoker   Pulmonary exam normal breath sounds clear to auscultation       Cardiovascular Exercise Tolerance: Good METS(-) hypertension(-) CAD and (-) Past MI negative cardio ROS (-) dysrhythmias  Rhythm:Regular Rate:Normal - Systolic murmurs    Neuro/Psych negative neurological ROS  negative psych ROS   GI/Hepatic ,neg GERD  ,,(+)     (-) substance abuse    Endo/Other  neg diabetes    Renal/GU negative Renal ROS     Musculoskeletal   Abdominal   Peds  Hematology   Anesthesia Other Findings Past Medical History: No date: Cholecystitis  Reproductive/Obstetrics                             Anesthesia Physical Anesthesia Plan  ASA: 2  Anesthesia Plan: General   Post-op Pain Management: Minimal or no pain anticipated   Induction: Intravenous  PONV Risk Score and Plan: 3 and Propofol infusion, TIVA and Ondansetron  Airway Management Planned: Nasal Cannula  Additional Equipment: None  Intra-op Plan:   Post-operative Plan:   Informed Consent: I have reviewed the patients History and Physical, chart, labs and discussed the procedure including the risks, benefits and alternatives for the proposed anesthesia with the patient or authorized representative who has indicated his/her understanding and acceptance.     Dental advisory given  Plan Discussed with: CRNA and Surgeon  Anesthesia Plan  Comments: (Discussed risks of anesthesia with patient, including possibility of difficulty with spontaneous ventilation under anesthesia necessitating airway intervention, PONV, and rare risks such as cardiac or respiratory or neurological events, and allergic reactions. Discussed the role of CRNA in patient's perioperative care. Patient understands.)       Anesthesia Quick Evaluation

## 2022-04-08 NOTE — Anesthesia Procedure Notes (Signed)
Procedure Name: MAC Date/Time: 04/08/2022 9:51 AM  Performed by: Jerrye Noble, CRNAPre-anesthesia Checklist: Patient identified, Emergency Drugs available, Suction available and Patient being monitored Patient Re-evaluated:Patient Re-evaluated prior to induction Oxygen Delivery Method: Nasal cannula

## 2022-04-08 NOTE — Anesthesia Postprocedure Evaluation (Signed)
Anesthesia Post Note  Patient: Carrie Gray  Procedure(s) Performed: COLONOSCOPY WITH PROPOFOL  Patient location during evaluation: PACU Anesthesia Type: General Level of consciousness: awake and alert Pain management: pain level controlled Vital Signs Assessment: post-procedure vital signs reviewed and stable Respiratory status: spontaneous breathing, nonlabored ventilation, respiratory function stable and patient connected to nasal cannula oxygen Cardiovascular status: blood pressure returned to baseline and stable Postop Assessment: no apparent nausea or vomiting Anesthetic complications: no   No notable events documented.   Last Vitals:  Vitals:   04/08/22 0921 04/08/22 1004  BP: 114/77 99/62  Pulse: 81 76  Resp: 20 19  Temp: (!) 35.7 C (!) 36.3 C  SpO2: 100% 100%    Last Pain:  Vitals:   04/08/22 1024  TempSrc:   PainSc: 0-No pain                 Arita Miss

## 2022-04-09 ENCOUNTER — Encounter: Payer: Self-pay | Admitting: Gastroenterology

## 2022-04-09 ENCOUNTER — Ambulatory Visit
Admission: RE | Admit: 2022-04-09 | Discharge: 2022-04-09 | Disposition: A | Discharge status: Home / Self Care | Payer: BC Managed Care – PPO | Attending: Gastroenterology | Admitting: Gastroenterology

## 2022-04-09 ENCOUNTER — Encounter
Admission: RE | Disposition: A | Discharge status: Home / Self Care | Payer: Self-pay | Source: Home / Self Care | Attending: Gastroenterology

## 2022-04-09 ENCOUNTER — Ambulatory Visit: Payer: BC Managed Care – PPO | Admitting: General Practice

## 2022-04-09 ENCOUNTER — Other Ambulatory Visit: Payer: Self-pay

## 2022-04-09 DIAGNOSIS — Z87891 Personal history of nicotine dependence: Secondary | ICD-10-CM | POA: Diagnosis not present

## 2022-04-09 DIAGNOSIS — Z1211 Encounter for screening for malignant neoplasm of colon: Secondary | ICD-10-CM | POA: Insufficient documentation

## 2022-04-09 DIAGNOSIS — Z79899 Other long term (current) drug therapy: Secondary | ICD-10-CM | POA: Diagnosis not present

## 2022-04-09 DIAGNOSIS — Z800 Family history of malignant neoplasm of digestive organs: Secondary | ICD-10-CM

## 2022-04-09 HISTORY — DX: Presence of spectacles and contact lenses: Z97.3

## 2022-04-09 HISTORY — PX: COLONOSCOPY WITH PROPOFOL: SHX5780

## 2022-04-09 LAB — SURGICAL PATHOLOGY

## 2022-04-09 SURGERY — COLONOSCOPY WITH PROPOFOL
Anesthesia: General | Site: Rectum

## 2022-04-09 MED ORDER — LIDOCAINE HCL (CARDIAC) PF 100 MG/5ML IV SOSY
PREFILLED_SYRINGE | INTRAVENOUS | Status: DC | PRN
  Administered 2022-04-09: 40 mg via INTRAVENOUS

## 2022-04-09 MED ORDER — PROPOFOL 10 MG/ML IV BOLUS
INTRAVENOUS | Status: DC | PRN
  Administered 2022-04-09: 140 ug/kg/min via INTRAVENOUS

## 2022-04-09 MED ORDER — LACTATED RINGERS IV SOLN: INTRAVENOUS | Status: DC

## 2022-04-09 MED ORDER — SODIUM CHLORIDE 0.9 % IV SOLN: INTRAVENOUS | Status: DC

## 2022-04-09 MED ORDER — STERILE WATER FOR IRRIGATION IR SOLN
Status: DC | PRN
  Administered 2022-04-09: 1

## 2022-04-09 MED ORDER — ONDANSETRON HCL 4 MG/2ML IJ SOLN
INTRAMUSCULAR | Status: DC | PRN
  Administered 2022-04-09: 4 mg via INTRAVENOUS

## 2022-04-09 SURGICAL SUPPLY — 25 items

## 2022-04-09 NOTE — Progress Notes (Signed)
noted 

## 2022-04-09 NOTE — H&P (Signed)
Cephas Darby, MD [address]  [place]  [place], [place] [address]  Main: [phone]  Fax: [phone] Pager: [phone]  Primary Care Physician:  Eugenia Pancoast, FNP Primary Gastroenterologist:  Dr. Cephas Darby  Pre-Procedure History & Physical: HPI:  Carrie Gray is a 57 y.o. female is here for an colonoscopy.   Past Medical History:  Diagnosis Date   Cholecystitis    Wears contact lenses     Past Surgical History:  Procedure Laterality Date   CHOLECYSTECTOMY N/A 10/17/2016   Procedure: LAPAROSCOPIC CHOLECYSTECTOMY WITH INTRAOPERATIVE CHOLANGIOGRAM;  Surgeon: Johnathan Hausen, MD;  Location: [facility];  Service: General;  Laterality: N/A;   COLONOSCOPY WITH PROPOFOL N/A 04/08/2022   Procedure: COLONOSCOPY WITH PROPOFOL;  Surgeon: Lin Landsman, MD;  Location: [facility];  Service: Gastroenterology;  Laterality: N/A;   ENDOSCOPIC RETROGRADE CHOLANGIOPANCREATOGRAPHY (ERCP) WITH PROPOFOL N/A 10/18/2016   Procedure: ENDOSCOPIC RETROGRADE CHOLANGIOPANCREATOGRAPHY (ERCP) WITH PROPOFOL;  Surgeon: Arta Silence, MD;  Location: [facility];  Service: Endoscopy;  Laterality: N/A;   GANGLION CYST EXCISION Right     Prior to Admission medications   Medication Sig Start Date End Date Taking? Authorizing Provider  atorvastatin (LIPITOR) 10 MG tablet Take 1 tablet (10 mg total) by mouth daily. 01/18/22  Yes Dugal, Lawerance Bach, FNP  ELDERBERRY PO Take by mouth daily.   Yes [provider]  ondansetron (ZOFRAN) 4 MG tablet Take 1 tablet (4 mg total) by mouth every 8 (eight) hours as needed for nausea or vomiting. 04/08/22  Yes Bibiana Gillean, Tally Due, MD    Allergies as of 04/08/2022   (No Known Allergies)    Family History  Problem Relation Age of Onset   Colon cancer Mother 26   Heart attack Father 39   Healthy Sister    Diabetes Maternal Grandmother    Skin cancer Paternal Uncle    Skin cancer Paternal Uncle    Breast cancer Neg Hx     Social History    Socioeconomic History   Marital status: Married    Spouse name: Not on file   Number of children: 3   Years of education: Not on file   Highest education level: Not on file  Occupational History   Occupation: Building control surveyor for kindergarden    Comment: [employer]  Tobacco Use   Smoking status: Former    Packs/day: 0.50    Years: 23.00    Additional pack years: 0.00    Total pack years: 11.50    Types: Cigarettes    Quit date: 01/11/1999    Years since quitting: 23.2   Smokeless tobacco: Never  Vaping Use   Vaping Use: Never used  Substance and Sexual Activity   Alcohol use: Yes    Comment: social   Drug use: No   Sexual activity: Yes    Partners: Male    Birth control/protection: None, Post-menopausal  Other Topics Concern   Not on file  Social History Narrative   Married, 3 children all girls.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   Social Determinants of Health   Financial Resource Strain: Not on file  Food Insecurity: Not on file  Transportation Needs: Not on file  Physical Activity: Not on file  Stress: Not on file  Social Connections: Not on file  Intimate Partner Violence: Not on file    Review of Systems: See HPI, otherwise negative ROS  Physical Exam: BP 105/68   Temp (!) 97.5 F (36.4 C) (Tympanic)   Resp  11   Ht 5' 4.02" (1.626 m)   Wt 77.6 kg   LMP 05/15/2018   SpO2 100%   BMI 29.34 kg/m  General:   Alert,  pleasant and cooperative in NAD Head:  Normocephalic and atraumatic. Neck:  Supple; no masses or thyromegaly. Lungs:  Clear throughout to auscultation.    Heart:  Regular rate and rhythm. Abdomen:  Soft, nontender and nondistended. Normal bowel sounds, without guarding, and without rebound.   Neurologic:  Alert and  oriented x4;  grossly normal neurologically.  Impression/Plan: Carrie Gray is here for an colonoscopy to be performed for colon cancer screening  Risks, benefits, limitations, and  alternatives regarding  colonoscopy have been reviewed with the patient.  Questions have been answered.  All parties agreeable.   Sherri Sear, MD  04/09/2022, 11:15 AM

## 2022-04-09 NOTE — Anesthesia Postprocedure Evaluation (Signed)
Anesthesia Post Note  Patient: Carrie Gray  Procedure(s) Performed: COLONOSCOPY WITH PROPOFOL (Rectum)  Patient location during evaluation: Endoscopy Anesthesia Type: General Level of consciousness: awake and alert Pain management: pain level controlled Vital Signs Assessment: post-procedure vital signs reviewed and stable Respiratory status: spontaneous breathing, nonlabored ventilation, respiratory function stable and patient connected to nasal cannula oxygen Cardiovascular status: blood pressure returned to baseline and stable Postop Assessment: no apparent nausea or vomiting Anesthetic complications: no   No notable events documented.   Last Vitals:  Vitals:   04/09/22 1230 04/09/22 1235  BP: (!) 89/64 103/66  Pulse: 64 70  Resp: 17 (!) 22  Temp:  36.5 C  SpO2: 99% 100%    Last Pain:  Vitals:   04/09/22 1235  TempSrc:   PainSc: 0-No pain                 Arita Miss

## 2022-04-09 NOTE — Transfer of Care (Signed)
Immediate Anesthesia Transfer of Care Note  Patient: Carrie Gray  Procedure(s) Performed: COLONOSCOPY WITH PROPOFOL (Rectum)  Patient Location: PACU  Anesthesia Type: General  Level of Consciousness: awake, alert  and patient cooperative  Airway and Oxygen Therapy: Patient Spontanous Breathing and Patient connected to supplemental oxygen  Post-op Assessment: Post-op Vital signs reviewed, Patient's Cardiovascular Status Stable, Respiratory Function Stable, Patent Airway and No signs of Nausea or vomiting  Post-op Vital Signs: Reviewed and stable  Complications: No notable events documented.

## 2022-04-09 NOTE — Op Note (Signed)
[facility] Patient Name: Carrie Gray Procedure Date: 04/09/2022 12:00 PM MRN: [id number] Account #: [id number] Date of Birth: [birth year] Admit Type: Outpatient Age: 56 Room: [facility] OR ROOM 01 Gender: Female Note Status: Finalized Instrument Name: [id number] Procedure:             Colonoscopy Indications:           Screening for colorectal malignant neoplasm, Screening                         for colorectal malignant neoplasm, last colonoscopy                         aborted (more recent than 10 years ago), Last                         colonoscopy: March 2024 Providers:             Lin Landsman MD, MD Referring MD:          Eugenia Pancoast (Referring MD) Medicines:             General Anesthesia Complications:         No immediate complications. Estimated blood loss: None. Procedure:             Pre-Anesthesia Assessment:                        - Prior to the procedure, a History and Physical was                         performed, and patient medications and allergies were                         reviewed. The patient is competent. The risks and                         benefits of the procedure and the sedation options and                         risks were discussed with the patient. All questions                         were answered and informed consent was obtained.                         Patient identification and proposed procedure were                         verified by the physician, the nurse, the                         anesthesiologist, the anesthetist and the technician                         in the pre-procedure area in the procedure room in the                         endoscopy suite. Mental Status Examination: alert and  oriented. Airway Examination: normal oropharyngeal                         airway and neck mobility. Respiratory Examination:                         clear to auscultation. CV  Examination: normal.                         Prophylactic Antibiotics: The patient does not require                         prophylactic antibiotics. Prior Anticoagulants: The                         patient has taken no anticoagulant or antiplatelet                         agents. ASA Grade Assessment: II - A patient with mild                         systemic disease. After reviewing the risks and                         benefits, the patient was deemed in satisfactory                         condition to undergo the procedure. The anesthesia                         plan was to use general anesthesia. Immediately prior                         to administration of medications, the patient was                         re-assessed for adequacy to receive sedatives. The                         heart rate, respiratory rate, oxygen saturations,                         blood pressure, adequacy of pulmonary ventilation, and                         response to care were monitored throughout the                         procedure. The physical status of the patient was                         re-assessed after the procedure.                        After obtaining informed consent, the colonoscope was                         passed under direct vision. Throughout the procedure,  the patient's blood pressure, pulse, and oxygen                         saturations were monitored continuously. The                         Colonoscope was introduced through the anus and                         advanced to the the cecum, identified by appendiceal                         orifice and ileocecal valve. The colonoscopy was                         performed without difficulty. The patient tolerated                         the procedure well. The quality of the bowel                         preparation was evaluated using the BBPS [facility] Bowel                         Preparation Scale) with  scores of: Right Colon = 3,                         Transverse Colon = 3 and Left Colon = 3 (entire mucosa                         seen well with no residual staining, small fragments                         of stool or opaque liquid). The total BBPS score                         equals 9. The terminal ileum, ileocecal valve,                         appendiceal orifice, and rectum were photographed. Findings:      The perianal and digital rectal examinations were normal. Pertinent       negatives include normal sphincter tone and no palpable rectal lesions.      The entire examined colon appeared normal.      The retroflexed view of the distal rectum and anal verge was normal and       showed no anal or rectal abnormalities. Impression:            - The entire examined colon is normal.                        - The distal rectum and anal verge are normal on                         retroflexion view.                        - No specimens collected. Recommendation:        -  Discharge patient to home (with escort).                        - Resume previous diet today.                        - Continue present medications.                        - Repeat colonoscopy in 10 years for screening                         purposes. Procedure Code(s):     --- Professional ---                        [id number], Colorectal cancer screening; colonoscopy on                         individual not meeting criteria for high risk Diagnosis Code(s):     --- Professional ---                        Z12.11, Encounter for screening for malignant neoplasm                         of colon CPT copyright 2022 [employer]. All rights reserved. The codes documented in this report are preliminary and upon coder review may  be revised to meet current compliance requirements. Dr. Ulyess Mort Lin Landsman MD, MD 04/09/2022 12:25:35 PM This report has been signed electronically. Number of Addenda:  0 Note Initiated On: 04/09/2022 12:00 PM Scope Withdrawal Time: 0 hours 9 minutes 31 seconds  Total Procedure Duration: 0 hours 12 minutes 49 seconds  Estimated Blood Loss:  Estimated blood loss: none.      [facility]

## 2022-04-09 NOTE — Anesthesia Preprocedure Evaluation (Signed)
Anesthesia Evaluation  Patient identified by MRN, date of birth, ID band Patient awake    Reviewed: Allergy & Precautions, NPO status , Patient's Chart, lab work & pertinent test results  History of Anesthesia Complications Negative for: history of anesthetic complications  Airway Mallampati: II  TM Distance: >3 FB Neck ROM: Full    Dental no notable dental hx. (+) Teeth Intact   Pulmonary neg pulmonary ROS, neg sleep apnea, neg COPD, Patient abstained from smoking.Not current smoker, former smoker   Pulmonary exam normal breath sounds clear to auscultation       Cardiovascular Exercise Tolerance: Good METS(-) hypertension(-) CAD and (-) Past MI negative cardio ROS (-) dysrhythmias  Rhythm:Regular Rate:Normal - Systolic murmurs    Neuro/Psych negative neurological ROS  negative psych ROS   GI/Hepatic ,neg GERD  ,,(+)     (-) substance abuse    Endo/Other  neg diabetes    Renal/GU negative Renal ROS     Musculoskeletal   Abdominal   Peds  Hematology   Anesthesia Other Findings Past Medical History: No date: Cholecystitis  Reproductive/Obstetrics                              Anesthesia Physical Anesthesia Plan  ASA: 2  Anesthesia Plan: General   Post-op Pain Management: Minimal or no pain anticipated   Induction: Intravenous  PONV Risk Score and Plan: 3 and Propofol infusion, TIVA and Ondansetron  Airway Management Planned: Nasal Cannula  Additional Equipment: None  Intra-op Plan:   Post-operative Plan:   Informed Consent: I have reviewed the patients History and Physical, chart, labs and discussed the procedure including the risks, benefits and alternatives for the proposed anesthesia with the patient or authorized representative who has indicated his/her understanding and acceptance.     Dental advisory given  Plan Discussed with: CRNA and Surgeon  Anesthesia Plan  Comments: (Discussed risks of anesthesia with patient, including possibility of difficulty with spontaneous ventilation under anesthesia necessitating airway intervention, PONV, and rare risks such as cardiac or respiratory or neurological events, and allergic reactions. Discussed the role of CRNA in patient's perioperative care. Patient understands.)        Anesthesia Quick Evaluation

## 2022-04-10 ENCOUNTER — Ambulatory Visit
Admission: RE | Admit: 2022-04-10 | Discharge: 2022-04-10 | Disposition: A | Discharge status: Home / Self Care | Payer: BC Managed Care – PPO | Source: Ambulatory Visit | Attending: Family | Admitting: Family

## 2022-04-10 DIAGNOSIS — Z1231 Encounter for screening mammogram for malignant neoplasm of breast: Secondary | ICD-10-CM

## 2022-04-29 ENCOUNTER — Ambulatory Visit: Payer: BC Managed Care – PPO | Admitting: Family

## 2022-05-01 ENCOUNTER — Encounter: Payer: Self-pay | Admitting: Family

## 2022-05-01 ENCOUNTER — Ambulatory Visit: Payer: BC Managed Care – PPO | Admitting: Family

## 2022-05-01 VITALS — BP 118/76 | HR 75 | Temp 98.1°F | Ht 64.02 in | Wt 171.8 lb

## 2022-05-01 DIAGNOSIS — E7800 Pure hypercholesterolemia, unspecified: Secondary | ICD-10-CM | POA: Diagnosis not present

## 2022-05-01 DIAGNOSIS — Z79899 Other long term (current) drug therapy: Secondary | ICD-10-CM | POA: Diagnosis not present

## 2022-05-01 DIAGNOSIS — R202 Paresthesia of skin: Secondary | ICD-10-CM | POA: Insufficient documentation

## 2022-05-01 DIAGNOSIS — Z23 Encounter for immunization: Secondary | ICD-10-CM | POA: Diagnosis not present

## 2022-05-01 LAB — LIPID PANEL
Cholesterol: 152 mg/dL (ref 0–200)
HDL: 53.1 mg/dL (ref >39.00)
LDL Cholesterol: 85 mg/dL (ref 0–99)
NonHDL: 99.19
Total CHOL/HDL Ratio: 3
Triglycerides: 71 mg/dL (ref 0.0–149.0)
VLDL: 14.2 mg/dL (ref 0.0–40.0)

## 2022-05-01 LAB — HEPATIC FUNCTION PANEL
ALT: 28 U/L (ref 0–35)
AST: 19 U/L (ref 0–37)
Albumin: 4.5 g/dL (ref 3.5–5.2)
Alkaline Phosphatase: 56 U/L (ref 39–117)
Bilirubin, Direct: 0.1 mg/dL (ref 0.0–0.3)
Total Bilirubin: 0.4 mg/dL (ref 0.2–1.2)
Total Protein: 6.9 g/dL (ref 6.0–8.3)

## 2022-05-01 NOTE — Assessment & Plan Note (Signed)
Right knee, subacute. Stable.  Will continue to monitor.  Pt declines knee xray, not necessary an urgent need at this time.  Did advise pt if the numbness increases and or knee weakness develops please f/u.  Suspected arthritis.

## 2022-05-01 NOTE — Progress Notes (Signed)
Established Patient Office Visit  Subjective:      CC:  Chief Complaint  Patient presents with   Medical Management of Chronic Issues    HPI: Carrie Gray is a 57 y.o. female presenting on 05/01/2022 for Medical Management of Chronic Issues . Hyperlipidemia: started on rosuvastatin 10 mg nightly, tolerating well. No myalgias.  Lab Results  Component Value Date   CHOL 274 (H) 01/17/2022   HDL 56.50 01/17/2022   LDLCALC 187 (H) 01/17/2022   TRIG 151.0 (H) 01/17/2022   CHOLHDL 5 01/17/2022   Acute concerns:  Has noticed numbness on right lateral knee for a few months but forgets about it because she only notices it when she touches the area and or is shaving in the shower. No known injury or trauma. No behind the knee pain.   Colonoscopy 326/24  Social history:  Relevant past medical, surgical, family and social history reviewed and updated as indicated. Interim medical history since our last visit reviewed.  Allergies and medications reviewed and updated.  DATA REVIEWED: CHART IN EPIC     ROS: Negative unless specifically indicated above in HPI.    Current Outpatient Medications:    atorvastatin (LIPITOR) 10 MG tablet, Take 1 tablet (10 mg total) by mouth daily., Disp: 90 tablet, Rfl: 3   ELDERBERRY PO, Take by mouth daily., Disp: , Rfl:       Objective:    BP 118/76 (BP Location: Left Arm)   Pulse 75   Temp 98.1 F (36.7 C) (Temporal)   Ht 5' 4.02" (1.626 m)   Wt 171 lb 12.8 oz (77.9 kg)   LMP 05/15/2018   SpO2 98%   BMI 29.47 kg/m   Wt Readings from Last 3 Encounters:  05/01/22 171 lb 12.8 oz (77.9 kg)  04/09/22 171 lb (77.6 kg)  04/08/22 172 lb 3.2 oz (78.1 kg)    Physical Exam Constitutional:      General: She is not in acute distress.    Appearance: Normal appearance. She is normal weight. She is not ill-appearing, toxic-appearing or diaphoretic.  HENT:     Head: Normocephalic.  Cardiovascular:     Rate and Rhythm: Normal rate.   Pulmonary:     Effort: Pulmonary effort is normal.  Musculoskeletal:        General: Normal range of motion.     Comments: Right lateral knee with very mild tenderness on palpation. Maybe very slight edema on right lateral knee. No tenderness with movement, full range of motion. Decreased sensation in small annual area lateral knee. No posterior bulge. No patellar swelling.   Neurological:     General: No focal deficit present.     Mental Status: She is alert and oriented to person, place, and time. Mental status is at baseline.  Psychiatric:        Mood and Affect: Mood normal.        Behavior: Behavior normal.        Thought Content: Thought content normal.        Judgment: Judgment normal.            Assessment & Plan:  Need for shingles vaccine -     Varicella-zoster vaccine IM  Pure hypercholesterolemia Assessment & Plan: Continue lipitor 10 mg once nightly Work on low chol diet exercise as tolerated Lipid panel ordered pending results  Orders: -     Lipid panel  On statin therapy -     Hepatic function panel  Paresthesia  of right lower extremity Assessment & Plan: Right knee, subacute. Stable.  Will continue to monitor.  Pt declines knee xray, not necessary an urgent need at this time.  Did advise pt if the numbness increases and or knee weakness develops please f/u.  Suspected arthritis.      Return in about 6 months (around 10/31/2022) for f/u cholesterol.  Mort Sawyers, MSN, APRN, FNP-C  [facility]

## 2022-05-01 NOTE — Assessment & Plan Note (Signed)
Continue lipitor 10 mg once nightly Work on low chol diet exercise as tolerated Lipid panel ordered pending results

## 2022-05-09 ENCOUNTER — Telehealth: Payer: Self-pay

## 2022-06-20 NOTE — Telephone Encounter (Signed)
dup

## 2022-07-31 ENCOUNTER — Ambulatory Visit (INDEPENDENT_AMBULATORY_CARE_PROVIDER_SITE_OTHER): Payer: BC Managed Care – PPO

## 2022-07-31 DIAGNOSIS — Z23 Encounter for immunization: Secondary | ICD-10-CM | POA: Diagnosis not present

## 2022-07-31 NOTE — Progress Notes (Signed)
Patient presented for 2nd Shingles injection given by Wendie Simmer, CMA to right deltoid, patient voiced no concerns nor showed any signs of distress during injection.

## 2022-10-03 ENCOUNTER — Ambulatory Visit: Payer: BC Managed Care – PPO | Admitting: Family Medicine

## 2022-10-03 ENCOUNTER — Encounter: Payer: Self-pay | Admitting: Family Medicine

## 2022-10-03 VITALS — BP 120/82 | HR 101 | Temp 98.8°F | Ht 64.02 in | Wt 174.2 lb

## 2022-10-03 DIAGNOSIS — B9789 Other viral agents as the cause of diseases classified elsewhere: Secondary | ICD-10-CM | POA: Insufficient documentation

## 2022-10-03 DIAGNOSIS — J040 Acute laryngitis: Secondary | ICD-10-CM

## 2022-10-03 DIAGNOSIS — R051 Acute cough: Secondary | ICD-10-CM | POA: Diagnosis not present

## 2022-10-03 NOTE — Progress Notes (Signed)
Patient ID: Carrie Gray, female    DOB: [birth year], 57 y.o.   MRN: [id number]  This visit was conducted in person.  BP 120/82   Pulse (!) 101   Temp 98.8 F (37.1 C) (Temporal)   Ht 5' 4.02" (1.626 m)   Wt 174 lb 3.2 oz (79 kg)   LMP 05/15/2018   SpO2 95%   BMI 29.88 kg/m    CC:  Chief Complaint  Patient presents with   Hoarse   Cough    Pt complains of losing voice on Monday, next day pt complains of chest congestion. Pt says she is coughing more and producing yellow mucus.     Subjective:   HPI: Carrie Gray is a 57 y.o. female presenting on 10/03/2022 for Hoarse and Cough (Pt complains of losing voice on Monday, next day pt complains of chest congestion. Pt says she is coughing more and producing yellow mucus. )    Date of onset:  4 days ago Initial symptoms included  hoarse  voice Symptoms progressed to chest congestion and productive cough.  No fever.  No SOB.   No ear pain, no face pain.    Sick contacts:  Paediatric nurse COVID testing:   none     She has tried to treat with  hot showers and cough drops.     No history of chronic lung disease such as asthma or COPD.  Former remote smoker.      Relevant past medical, surgical, family and social history reviewed and updated as indicated. Interim medical history since our last visit reviewed. Allergies and medications reviewed and updated. Outpatient Medications Prior to Visit  Medication Sig Dispense Refill   atorvastatin (LIPITOR) 10 MG tablet Take 1 tablet (10 mg total) by mouth daily. 90 tablet 3   ELDERBERRY PO Take by mouth daily.     No facility-administered medications prior to visit.     Per HPI unless specifically indicated in ROS section below Review of Systems  Constitutional:  Negative for fatigue and fever.  HENT:  Positive for congestion and voice change.   Eyes:  Negative for pain.  Respiratory:  Positive for cough. Negative for shortness of breath.    Cardiovascular:  Negative for chest pain, palpitations and leg swelling.  Gastrointestinal:  Negative for abdominal pain.  Genitourinary:  Negative for dysuria and vaginal bleeding.  Musculoskeletal:  Negative for back pain.  Neurological:  Negative for syncope, light-headedness and headaches.  Psychiatric/Behavioral:  Negative for dysphoric mood.    Objective:  BP 120/82   Pulse (!) 101   Temp 98.8 F (37.1 C) (Temporal)   Ht 5' 4.02" (1.626 m)   Wt 174 lb 3.2 oz (79 kg)   LMP 05/15/2018   SpO2 95%   BMI 29.88 kg/m   Wt Readings from Last 3 Encounters:  10/03/22 174 lb 3.2 oz (79 kg)  05/01/22 171 lb 12.8 oz (77.9 kg)  04/09/22 171 lb (77.6 kg)      Physical Exam Constitutional:      General: She is not in acute distress.    Appearance: Normal appearance. She is well-developed. She is not ill-appearing or toxic-appearing.  HENT:     Head: Normocephalic.     Right Ear: Hearing, ear canal and external ear normal. No middle ear effusion. Tympanic membrane is not erythematous, retracted or bulging.     Left Ear: Hearing, ear canal and external ear normal.  No middle ear effusion. Tympanic membrane  is not erythematous, retracted or bulging.     Nose: Mucosal edema present. No rhinorrhea.     Right Sinus: No maxillary sinus tenderness or frontal sinus tenderness.     Left Sinus: No maxillary sinus tenderness or frontal sinus tenderness.     Mouth/Throat:     Mouth: Oropharynx is clear and moist and mucous membranes are normal.     Pharynx: Uvula midline.     Tonsils: No tonsillar exudate or tonsillar abscesses. 2+ on the right. 2+ on the left.  Eyes:     General: Lids are normal. Lids are everted, no foreign bodies appreciated.     Extraocular Movements: EOM normal.     Conjunctiva/sclera: Conjunctivae normal.     Pupils: Pupils are equal, round, and reactive to light.  Neck:     Thyroid: No thyroid mass or thyromegaly.     Vascular: No carotid bruit.     Trachea: Trachea  normal.  Cardiovascular:     Rate and Rhythm: Normal rate and regular rhythm.     Pulses: Normal pulses.     Heart sounds: Normal heart sounds, S1 normal and S2 normal. No murmur heard.    No friction rub. No gallop.  Pulmonary:     Effort: Pulmonary effort is normal. No tachypnea or respiratory distress.     Breath sounds: Normal breath sounds. No decreased breath sounds, wheezing, rhonchi or rales.  Abdominal:     General: Bowel sounds are normal.     Palpations: Abdomen is soft.     Tenderness: There is no abdominal tenderness.  Musculoskeletal:     Cervical back: Normal range of motion and neck supple.  Skin:    General: Skin is warm, dry and intact.     Findings: No rash.  Neurological:     Mental Status: She is alert.  Psychiatric:        Mood and Affect: Mood is not anxious or depressed.        Speech: Speech normal.        Behavior: Behavior normal. Behavior is cooperative.        Thought Content: Thought content normal.        Cognition and Memory: Cognition and memory normal.        Judgment: Judgment normal.       Results for orders placed or performed in visit on 05/01/22  Lipid panel  Result Value Ref Range   Cholesterol 152 0 - 200 mg/dL   Triglycerides 81.1 0.0 - 149.0 mg/dL   HDL 91.47 >82.95 mg/dL   VLDL 62.1 0.0 - 30.8 mg/dL   LDL Cholesterol 85 0 - 99 mg/dL   Total CHOL/HDL Ratio 3    NonHDL 99.19   Hepatic Function Panel  Result Value Ref Range   Total Bilirubin 0.4 0.2 - 1.2 mg/dL   Bilirubin, Direct 0.1 0.0 - 0.3 mg/dL   Alkaline Phosphatase 56 39 - 117 U/L   AST 19 0 - 37 U/L   ALT 28 0 - 35 U/L   Total Protein 6.9 6.0 - 8.3 g/dL   Albumin 4.5 3.5 - 5.2 g/dL    Assessment and Plan  Acute cough  Viral laryngitis Assessment & Plan: Acute, most likely secondary to viral upper respiratory tract infection.  Recommended symptomatic supportive care.  Emphasized voice rest.  COVID testing  negative  Return and ER precautions  provided.     No follow-ups on file.   Kerby Nora, MD

## 2022-10-03 NOTE — Assessment & Plan Note (Addendum)
Acute, most likely secondary to viral upper respiratory tract infection.  Recommended symptomatic supportive care.  Emphasized voice rest.  COVID testing  negative  Return and ER precautions provided.

## 2022-10-03 NOTE — Patient Instructions (Addendum)
Rest, fluids. Voice rest, as much as able. Can use Mucinex DM twice daily as needed for cough and congestion. Call if symptoms or not improving as expected over the next 7 to 10 days.  Go to the emergency room if you have severe shortness of breath.

## 2022-10-31 ENCOUNTER — Encounter: Payer: Self-pay | Admitting: Family

## 2022-10-31 ENCOUNTER — Ambulatory Visit: Payer: BC Managed Care – PPO | Admitting: Family

## 2022-10-31 VITALS — BP 122/76 | HR 73 | Temp 97.8°F | Ht 64.0 in | Wt 168.6 lb

## 2022-10-31 DIAGNOSIS — J309 Allergic rhinitis, unspecified: Secondary | ICD-10-CM | POA: Diagnosis not present

## 2022-10-31 DIAGNOSIS — E7800 Pure hypercholesterolemia, unspecified: Secondary | ICD-10-CM | POA: Diagnosis not present

## 2022-10-31 DIAGNOSIS — J40 Bronchitis, not specified as acute or chronic: Secondary | ICD-10-CM | POA: Diagnosis not present

## 2022-10-31 LAB — LIPID PANEL
Cholesterol: 166 mg/dL (ref 0–200)
HDL: 53.3 mg/dL (ref >39.00)
LDL Cholesterol: 96 mg/dL (ref 0–99)
NonHDL: 112.21
Total CHOL/HDL Ratio: 3
Triglycerides: 81 mg/dL (ref 0.0–149.0)
VLDL: 16.2 mg/dL (ref 0.0–40.0)

## 2022-10-31 MED ORDER — PREDNISONE 10 MG (21) PO TBPK
ORAL_TABLET | ORAL | 0 refills | Status: AC
Start: 2022-10-31 — End: ?

## 2022-10-31 NOTE — Assessment & Plan Note (Signed)
Suspect ddx lingering bronchitis vs allergies. Dicussed options to include antihistamine or nasal corticosteroid however pt does not wish to take these. She will take sudafed advised can take prn.  Rx prednisone pack at this time.  If no improvement please f/u

## 2022-10-31 NOTE — Progress Notes (Signed)
Established Patient Office Visit  Subjective:      CC:  Chief Complaint  Patient presents with   Hyperlipidemia    HPI: Carrie Gray is a 57 y.o. female presenting on 10/31/2022 for Hyperlipidemia . Hyperlipidemia: started on atorvastatin 10 mg once nightly tolerating well.   Wt Readings from Last 3 Encounters:  10/31/22 168 lb 9.6 oz (76.5 kg)  10/03/22 174 lb 3.2 oz (79 kg)  05/01/22 171 lb 12.8 oz (77.9 kg)   Saw Dr. Ermalene Searing in our clinic 9/19 and still with ongoing nasal congestion and right inner ear fluid. When she blows her nose it is clear. She has some chest congestion as well, feels like drainage that settles in her chest. No sore throat and or sinus pressure. Cough is productive and yellow.  Cough seems to get slightly better but its going on four weeks.        Social history:  Relevant past medical, surgical, family and social history reviewed and updated as indicated. Interim medical history since our last visit reviewed.  Allergies and medications reviewed and updated.  DATA REVIEWED: CHART IN EPIC     ROS: Negative unless specifically indicated above in HPI.    Current Outpatient Medications:    atorvastatin (LIPITOR) 10 MG tablet, Take 1 tablet (10 mg total) by mouth daily., Disp: 90 tablet, Rfl: 3   ELDERBERRY PO, Take by mouth daily., Disp: , Rfl:    predniSONE (STERAPRED UNI-PAK 21 TAB) 10 MG (21) TBPK tablet, Take as directed, Disp: 1 each, Rfl: 0      Objective:    BP 122/76 (BP Location: Left Arm, Patient Position: Sitting, Cuff Size: Normal)   Pulse 73   Temp 97.8 F (36.6 C) (Oral)   Ht 5\' 4"  (1.626 m)   Wt 168 lb 9.6 oz (76.5 kg)   LMP 05/15/2018   SpO2 99%   BMI 28.94 kg/m   Wt Readings from Last 3 Encounters:  10/31/22 168 lb 9.6 oz (76.5 kg)  10/03/22 174 lb 3.2 oz (79 kg)  05/01/22 171 lb 12.8 oz (77.9 kg)    Physical Exam Constitutional:      General: She is not in acute distress.    Appearance: Normal  appearance. She is normal weight. She is not ill-appearing, toxic-appearing or diaphoretic.  HENT:     Head: Normocephalic.     Right Ear: Tympanic membrane normal.     Left Ear: Tympanic membrane normal.     Nose: Nose normal.     Mouth/Throat:     Mouth: Mucous membranes are dry.     Pharynx: No oropharyngeal exudate or posterior oropharyngeal erythema.  Eyes:     Extraocular Movements: Extraocular movements intact.     Pupils: Pupils are equal, round, and reactive to light.  Cardiovascular:     Rate and Rhythm: Normal rate and regular rhythm.     Pulses: Normal pulses.     Heart sounds: Normal heart sounds.  Pulmonary:     Effort: Pulmonary effort is normal.     Breath sounds: Normal breath sounds.  Musculoskeletal:     Cervical back: Normal range of motion.  Neurological:     General: No focal deficit present.     Mental Status: She is alert and oriented to person, place, and time. Mental status is at baseline.  Psychiatric:        Mood and Affect: Mood normal.        Behavior: Behavior normal.  Thought Content: Thought content normal.        Judgment: Judgment normal.           Assessment & Plan:  Bronchitis -     predniSONE; Take as directed  Dispense: 1 each; Refill: 0  Pure hypercholesterolemia -     Lipid panel  Allergic rhinitis, unspecified seasonality, unspecified trigger Assessment & Plan: Suspect ddx lingering bronchitis vs allergies. Dicussed options to include antihistamine or nasal corticosteroid however pt does not wish to take these. She will take sudafed advised can take prn.  Rx prednisone pack at this time.  If no improvement please f/u      Return in about 1 year (around 10/31/2023) for f/u CPE.  Mort Sawyers, MSN, APRN, FNP-C [facility]

## 2022-12-16 ENCOUNTER — Ambulatory Visit: Payer: BC Managed Care – PPO | Admitting: Dermatology

## 2022-12-16 DIAGNOSIS — C44619 Basal cell carcinoma of skin of left upper limb, including shoulder: Secondary | ICD-10-CM | POA: Diagnosis not present

## 2022-12-16 DIAGNOSIS — R238 Other skin changes: Secondary | ICD-10-CM

## 2022-12-16 DIAGNOSIS — D492 Neoplasm of unspecified behavior of bone, soft tissue, and skin: Secondary | ICD-10-CM

## 2022-12-16 DIAGNOSIS — Z1283 Encounter for screening for malignant neoplasm of skin: Secondary | ICD-10-CM | POA: Diagnosis not present

## 2022-12-16 DIAGNOSIS — D2239 Melanocytic nevi of other parts of face: Secondary | ICD-10-CM

## 2022-12-16 DIAGNOSIS — W908XXA Exposure to other nonionizing radiation, initial encounter: Secondary | ICD-10-CM | POA: Diagnosis not present

## 2022-12-16 DIAGNOSIS — L578 Other skin changes due to chronic exposure to nonionizing radiation: Secondary | ICD-10-CM

## 2022-12-16 DIAGNOSIS — Z808 Family history of malignant neoplasm of other organs or systems: Secondary | ICD-10-CM

## 2022-12-16 DIAGNOSIS — C4491 Basal cell carcinoma of skin, unspecified: Secondary | ICD-10-CM

## 2022-12-16 DIAGNOSIS — D229 Melanocytic nevi, unspecified: Secondary | ICD-10-CM

## 2022-12-16 DIAGNOSIS — D225 Melanocytic nevi of trunk: Secondary | ICD-10-CM

## 2022-12-16 DIAGNOSIS — L821 Other seborrheic keratosis: Secondary | ICD-10-CM

## 2022-12-16 DIAGNOSIS — D1801 Hemangioma of skin and subcutaneous tissue: Secondary | ICD-10-CM

## 2022-12-16 DIAGNOSIS — L814 Other melanin hyperpigmentation: Secondary | ICD-10-CM

## 2022-12-16 HISTORY — DX: Basal cell carcinoma of skin, unspecified: C44.91

## 2022-12-16 NOTE — Progress Notes (Unsigned)
New Patient Visit   Subjective  Carrie Gray is a 57 y.o. female who presents for the following: Skin Cancer Screening and Full Body Skin Exam, no hx of skin cancer, fhx of skin cancer fathers siblings.  Pink spot on shoulder has been there for a long time.  New patient referral from Mort Sawyers, FNP.  The patient presents for Total-Body Skin Exam (TBSE) for skin cancer screening and mole check. The patient has spots, moles and lesions to be evaluated, some may be new or changing and the patient may have concern these could be cancer.    The following portions of the chart were reviewed this encounter and updated as appropriate: medications, allergies, medical history  Review of Systems:  No other skin or systemic complaints except as noted in HPI or Assessment and Plan.  Objective  Well appearing patient in no apparent distress; mood and affect are within normal limits.  A full examination was performed including scalp, head, eyes, ears, nose, lips, neck, chest, axillae, abdomen, back, buttocks, bilateral upper extremities, bilateral lower extremities, hands, feet, fingers, toes, fingernails, and toenails. All findings within normal limits unless otherwise noted below.   Relevant physical exam findings are noted in the Assessment and Plan.  L ant shoulder Pink pearly patch 1.6cm       Assessment & Plan   SKIN CANCER SCREENING PERFORMED TODAY.  ACTINIC DAMAGE - Chronic condition, secondary to cumulative UV/sun exposure - diffuse scaly erythematous macules with underlying dyspigmentation - Recommend daily broad spectrum sunscreen SPF 30+ to sun-exposed areas, reapply every 2 hours as needed.  - Staying in the shade or wearing long sleeves, sun glasses (UVA+UVB protection) and wide brim hats (4-inch brim around the entire circumference of the hat) are also recommended for sun protection.  - Call for new or changing lesions.  LENTIGINES, SEBORRHEIC KERATOSES,  HEMANGIOMAS - Benign normal skin lesions - Benign-appearing - Call for any changes  MELANOCYTIC NEVI - Tan-brown and/or pink-flesh-colored symmetric macules and papules - Benign appearing on exam today - Observation - Call clinic for new or changing moles - Recommend daily use of broad spectrum spf 30+ sunscreen to sun-exposed areas.  - Chin 4.58mm flesh pap plan bx on f/u - L eyebrow 0.6cm flesh pap - L upper chest 8.71mm flesh pap plan bx on f/u  DILATED PORE VS EARLY AK R upper nasal dorsum Exam: light pink slightly scaly tiny dilated pore  Treatment Plan: Benign appearing, observe for changes, recheck on f/u     Neoplasm of skin L ant shoulder  Epidermal / dermal shaving  Lesion diameter (cm):  1.6 Informed consent: discussed and consent obtained   Patient was prepped and draped in usual sterile fashion: area prepped with alcohol. Anesthesia: the lesion was anesthetized in a standard fashion   Anesthetic:  1% lidocaine w/ epinephrine 1-100,000 buffered w/ 8.4% NaHCO3 Instrument used: flexible razor blade   Hemostasis achieved with: pressure, aluminum chloride and electrodesiccation   Outcome: patient tolerated procedure well    Destruction of lesion  Destruction method: electrodesiccation and curettage   Informed consent: discussed and consent obtained   Curettage performed in three different directions: Yes   Electrodesiccation performed over the curetted area: Yes   Final wound size (cm):  1.8 Hemostasis achieved with:  pressure, aluminum chloride and electrodesiccation Outcome: patient tolerated procedure well with no complications   Post-procedure details: wound care instructions given   Additional details:  Mupirocin ointment and Bandaid applied   Specimen 1 -  Surgical pathology Differential Diagnosis: R/O BCC  Check Margins: yes Pink pearly patch 1.6cm EDC    Return for bx x 3 Irritated Nevi chin, L upper chest, eyebrow, recheck R upper nasal dorsum,  45m f/u.  I, Ardis Rowan, RMA, am acting as scribe for Willeen Niece, MD .   Documentation: I have reviewed the above documentation for accuracy and completeness, and I agree with the above.  Willeen Niece, MD

## 2022-12-16 NOTE — Patient Instructions (Addendum)

## 2022-12-17 ENCOUNTER — Telehealth: Payer: Self-pay

## 2022-12-17 LAB — SURGICAL PATHOLOGY

## 2022-12-17 NOTE — Telephone Encounter (Signed)
-----   Message from Willeen Niece sent at 12/17/2022  5:23 PM EST ----- 1. Skin, L ant shoulder :       SUPERFICIAL AND NODULAR BASAL CELL CARCINOMA, PERIPHERAL AND DEEP MARGINS       INVOLVED   BCC skin cancer- already treated with EDC at time of biopsy    - please call patient

## 2022-12-17 NOTE — Telephone Encounter (Signed)
Advised pt of bx results/sh ?

## 2023-01-27 ENCOUNTER — Ambulatory Visit: Payer: BC Managed Care – PPO | Admitting: Dermatology

## 2023-01-27 DIAGNOSIS — D2239 Melanocytic nevi of other parts of face: Secondary | ICD-10-CM | POA: Diagnosis not present

## 2023-01-27 DIAGNOSIS — Z85828 Personal history of other malignant neoplasm of skin: Secondary | ICD-10-CM

## 2023-01-27 DIAGNOSIS — D492 Neoplasm of unspecified behavior of bone, soft tissue, and skin: Secondary | ICD-10-CM

## 2023-01-27 DIAGNOSIS — L814 Other melanin hyperpigmentation: Secondary | ICD-10-CM | POA: Diagnosis not present

## 2023-01-27 DIAGNOSIS — D2262 Melanocytic nevi of left upper limb, including shoulder: Secondary | ICD-10-CM

## 2023-01-27 NOTE — Patient Instructions (Addendum)

## 2023-01-27 NOTE — Progress Notes (Signed)
 Follow-Up Visit   Subjective  Carrie Gray is a 58 y.o. female who presents for the following: The patient has moles on her face and chest growing and irritated, she would like removed today.    The following portions of the chart were reviewed this encounter and updated as appropriate: medications, allergies, medical history  Review of Systems:  No other skin or systemic complaints except as noted in HPI or Assessment and Plan.  Objective  Well appearing patient in no apparent distress; mood and affect are within normal limits.   A focused examination was performed of the following areas:face,chest,back    Relevant exam findings are noted in the Assessment and Plan.           left eyebrow 0.6 cm flesh papule  central chin 0.4 cm flesh papule  left clavicle 0.8 cm flesh papule   Assessment & Plan     NEOPLASM OF SKIN (3) left eyebrow Epidermal / dermal shaving  Lesion diameter (cm):  0.6 Informed consent: discussed and consent obtained   Patient was prepped and draped in usual sterile fashion: area prepped with alcohol. Anesthesia: the lesion was anesthetized in a standard fashion   Anesthetic:  1% lidocaine  w/ epinephrine  1-100,000 buffered w/ 8.4% NaHCO3 Instrument used: flexible razor blade   Hemostasis achieved with: pressure, aluminum chloride and electrodesiccation   Outcome: patient tolerated procedure well   Post-procedure details: wound care instructions given   Post-procedure details comment:  Ointment and small bandage applied Specimen 1 - Surgical pathology Differential Diagnosis: Irritated nevus vs other   Check Margins: No central chin Epidermal / dermal shaving  Lesion diameter (cm):  0.4 Informed consent: discussed and consent obtained   Patient was prepped and draped in usual sterile fashion: area prepped with alcohol. Anesthesia: the lesion was anesthetized in a standard fashion   Anesthetic:  1% lidocaine  w/ epinephrine   1-100,000 buffered w/ 8.4% NaHCO3 Instrument used: flexible razor blade   Hemostasis achieved with: pressure, aluminum chloride and electrodesiccation   Outcome: patient tolerated procedure well   Post-procedure details: wound care instructions given   Post-procedure details comment:  Ointment and small bandage applied Specimen 3 - Surgical pathology Differential Diagnosis: Irritated nevus vs other  Check Margins: No left clavicle Epidermal / dermal shaving  Lesion diameter (cm):  0.8 Informed consent: discussed and consent obtained   Patient was prepped and draped in usual sterile fashion: area prepped with alcohol. Anesthesia: the lesion was anesthetized in a standard fashion   Anesthetic:  1% lidocaine  w/ epinephrine  1-100,000 buffered w/ 8.4% NaHCO3 Instrument used: flexible razor blade   Hemostasis achieved with: pressure, aluminum chloride and electrodesiccation   Outcome: patient tolerated procedure well   Post-procedure details: wound care instructions given   Post-procedure details comment:  Ointment and small bandage applied Specimen 2 - Surgical pathology Differential Diagnosis: Irritated nevus vs other  Check Margins: No Discussed resulting small scar with shave removal, and possible recurrence of lesion.  Recommend vaseline ointment to area daily and cover until healed.  Recommend photoprotection/sunscreen to area to prevent discoloration of scar.  Once healed, may apply OTC Serica scar gel bid to thickened scars.   HISTORY OF BASAL CELL CARCINOMA OF THE SKIN Left anterior shoulder  removed and treated with [facility] 12/16/2022 - No evidence of recurrence today - Recommend regular full body skin exams - Recommend daily broad spectrum sunscreen SPF 30+ to sun-exposed areas, reapply every 2 hours as needed.  - Call if any new or  changing lesions are noted between office visits   LENTIGINES Exam: scattered tan macules Due to sun exposure Treatment Plan: Benign-appearing,  observe. Recommend daily broad spectrum sunscreen SPF 30+ to sun-exposed areas, reapply every 2 hours as needed.  Call for any changes   Return in about 6 months (around 07/27/2023) for hx of BCC.  I, Fay Kirks, CMA, am acting as scribe for Rexene Rattler, MD .   Documentation: I have reviewed the above documentation for accuracy and completeness, and I agree with the above.  Rexene Rattler, MD

## 2023-01-30 LAB — SURGICAL PATHOLOGY

## 2023-02-03 ENCOUNTER — Telehealth: Payer: Self-pay

## 2023-02-03 NOTE — Telephone Encounter (Signed)
Advised patient all 3 biopsies were benign nevi, no further treatment needed.

## 2023-02-03 NOTE — Telephone Encounter (Signed)
-----   Message from Willeen Niece sent at 02/03/2023  1:46 PM EST ----- 1. Skin, left eyebrow :       MELANOCYTIC NEVUS, INTRADERMAL TYPE, IRRITATED    2. Skin, left clavicle :       MELANOCYTIC NEVUS, INTRADERMAL TYPE, IRRITATED    3. Skin, central chin :       MELANOCYTIC NEVUS, INTRADERMAL TYPE, IRRITATED   All three benign irritated moles - please call patient

## 2023-04-16 ENCOUNTER — Other Ambulatory Visit: Payer: Self-pay | Admitting: Family

## 2023-04-16 DIAGNOSIS — E782 Mixed hyperlipidemia: Secondary | ICD-10-CM

## 2023-06-17 ENCOUNTER — Ambulatory Visit: Admitting: Dermatology

## 2023-06-17 ENCOUNTER — Encounter: Payer: Self-pay | Admitting: Dermatology

## 2023-06-17 DIAGNOSIS — Z85828 Personal history of other malignant neoplasm of skin: Secondary | ICD-10-CM

## 2023-06-17 DIAGNOSIS — L821 Other seborrheic keratosis: Secondary | ICD-10-CM

## 2023-06-17 NOTE — Patient Instructions (Signed)

## 2023-06-17 NOTE — Progress Notes (Signed)
   Follow-Up Visit   Subjective  Carrie Gray is a 58 y.o. female who presents for the following: hx of BCC at left shoulder. Treated with EDC on 12/16/22 by Dr. Annette Barters. Patient moving to [facility] and wanted to have rechecked before leaving. Also a spot at right forearm that she picks at.   The patient has spots, moles and lesions to be evaluated, some may be new or changing and the patient may have concern these could be cancer.   The following portions of the chart were reviewed this encounter and updated as appropriate: medications, allergies, medical history  Review of Systems:  No other skin or systemic complaints except as noted in HPI or Assessment and Plan.  Objective  Well appearing patient in no apparent distress; mood and affect are within normal limits.   A focused examination was performed of the following areas: Left shoulder, right arm  Relevant exam findings are noted in the Assessment and Plan.    Assessment & Plan   HISTORY OF BASAL CELL CARCINOMA OF THE SKIN at left anterior shoulder - No evidence of recurrence today - Recommend regular full body skin exams - Recommend daily broad spectrum sunscreen SPF 30+ to sun-exposed areas, reapply every 2 hours as needed.  - Call if any new or changing lesions are noted between office visits  SEBORRHEIC KERATOSIS - Stuck-on, waxy, tan-brown papules R forearm - Benign-appearing - Discussed benign etiology and prognosis. - Observe - Call for any changes   HISTORY OF BASAL CELL CARCINOMA (BCC) OF SKIN   SEBORRHEIC KERATOSES    Return if symptoms worsen or fail to improve.  Kerstin Peeling, RMA, am acting as scribe for Harris Liming, MD .   Documentation: I have reviewed the above documentation for accuracy and completeness, and I agree with the above.  Harris Liming, MD

## 2023-07-01 ENCOUNTER — Ambulatory Visit: Payer: BC Managed Care – PPO | Admitting: Dermatology
